# Patient Record
Sex: Male | Born: 1935 | Race: White | Hispanic: No | Smoking: Former smoker
Health system: Southern US, Community
[De-identification: ages and names within clinical notes are randomized; demographics above are authoritative.]

## PROBLEM LIST (undated history)

## (undated) DIAGNOSIS — I639 Cerebral infarction, unspecified: Secondary | ICD-10-CM

## (undated) DIAGNOSIS — J449 Chronic obstructive pulmonary disease, unspecified: Secondary | ICD-10-CM

## (undated) DIAGNOSIS — J189 Pneumonia, unspecified organism: Secondary | ICD-10-CM

## (undated) DIAGNOSIS — T17908A Unspecified foreign body in respiratory tract, part unspecified causing other injury, initial encounter: Secondary | ICD-10-CM

## (undated) HISTORY — PX: JOINT REPLACEMENT: SHX530

---

## 2017-04-29 ENCOUNTER — Encounter (HOSPITAL_COMMUNITY): Payer: Self-pay | Admitting: Emergency Medicine

## 2017-04-29 ENCOUNTER — Emergency Department (HOSPITAL_COMMUNITY)
Admission: EM | Admit: 2017-04-29 | Discharge: 2017-04-29 | Disposition: A | Discharge status: Home / Self Care | Payer: Medicare Other

## 2017-04-29 ENCOUNTER — Emergency Department (HOSPITAL_COMMUNITY): Payer: Medicare Other

## 2017-04-29 ENCOUNTER — Emergency Department (HOSPITAL_COMMUNITY)
Admission: EM | Admit: 2017-04-29 | Discharge: 2017-04-29 | Disposition: A | Discharge status: Home / Self Care | Payer: Medicare Other | Attending: Emergency Medicine | Admitting: Emergency Medicine

## 2017-04-29 DIAGNOSIS — R42 Dizziness and giddiness: Secondary | ICD-10-CM | POA: Insufficient documentation

## 2017-04-29 DIAGNOSIS — N3000 Acute cystitis without hematuria: Secondary | ICD-10-CM | POA: Diagnosis not present

## 2017-04-29 DIAGNOSIS — R109 Unspecified abdominal pain: Secondary | ICD-10-CM | POA: Diagnosis present

## 2017-04-29 LAB — URINALYSIS, ROUTINE W REFLEX MICROSCOPIC
Bilirubin Urine: NEGATIVE
Glucose, UA: NEGATIVE mg/dL
KETONES UR: NEGATIVE mg/dL
Nitrite: POSITIVE — AB
PROTEIN: NEGATIVE mg/dL
Specific Gravity, Urine: 1.009 (ref 1.005–1.030)
pH: 6 (ref 5.0–8.0)

## 2017-04-29 LAB — COMPREHENSIVE METABOLIC PANEL
ALBUMIN: 3.7 g/dL (ref 3.5–5.0)
ALK PHOS: 51 U/L (ref 38–126)
ALT: 12 U/L — AB (ref 17–63)
ANION GAP: 7 (ref 5–15)
AST: 17 U/L (ref 15–41)
Anion gap: 7 (ref 5–15)
BUN: 20 mg/dL (ref 6–20)
CALCIUM: 8.3 mg/dL — AB (ref 8.9–10.3)
CO2: 25 mmol/L (ref 22–32)
CREATININE: 0.9 mg/dL (ref 0.61–1.24)
Chloride: 101 mmol/L (ref 101–111)
GFR calc Af Amer: >60 mL/min (ref >60)
GFR calc non Af Amer: >60 mL/min (ref >60)
GLUCOSE: 109 mg/dL — AB (ref 65–99)
Potassium: 3.9 mmol/L (ref 3.5–5.1)
SODIUM: 133 mmol/L — AB (ref 135–145)
Total Bilirubin: 0.4 mg/dL (ref 0.3–1.2)
Total Protein: 6.2 g/dL — ABNORMAL LOW (ref 6.5–8.1)

## 2017-04-29 LAB — CBC
HCT: 28.4 % — ABNORMAL LOW (ref 39.0–52.0)
Hemoglobin: 10 g/dL — ABNORMAL LOW (ref 13.0–17.0)
MCH: 31.4 pg (ref 26.0–34.0)
MCHC: 35.2 g/dL (ref 30.0–36.0)
MCV: 89.3 fL (ref 78.0–100.0)
PLATELETS: 135 10*3/uL — AB (ref 150–400)
RBC: 3.18 MIL/uL — ABNORMAL LOW (ref 4.22–5.81)
RDW: 13.3 % (ref 11.5–15.5)
WBC: 9.6 10*3/uL (ref 4.0–10.5)

## 2017-04-29 LAB — LIPASE, BLOOD: Lipase: 32 U/L (ref 11–51)

## 2017-04-29 MED ORDER — SODIUM CHLORIDE 0.9 % IV BOLUS (SEPSIS)
1000.0000 mL | Freq: Once | INTRAVENOUS | Status: AC
  Administered 2017-04-29: 1000 mL via INTRAVENOUS

## 2017-04-29 MED ORDER — SODIUM CHLORIDE 0.9 % IV BOLUS (SEPSIS)
1000.0000 mL | Freq: Once | INTRAVENOUS | Status: DC
Start: 2017-04-29 — End: 2017-04-29

## 2017-04-29 MED ORDER — CEPHALEXIN 500 MG PO CAPS: 500.0000 mg | ORAL_CAPSULE | Freq: Two times a day (BID) | ORAL | 0 refills | Status: DC

## 2017-04-29 MED ORDER — DEXTROSE 5 % IV SOLN
1.0000 g | Freq: Once | INTRAVENOUS | Status: AC
  Administered 2017-04-29: 1 g via INTRAVENOUS
  Filled 2017-04-29: qty 10

## 2017-04-29 NOTE — ED Triage Notes (Addendum)
Pt comes to ed, via ems from home c/o generalized abdominal pain with diarrhea. Pt has slurred speech/ left side deficients and dementia at base line per report to ems to caregiver ( grand daughter) home address [address], [place] [phone]. Pt walks w walker and assistance. Hx of TIA. V/s on arrival 109/60, hr 50 reg rr16, spo2 97.   NOTE HIS WIFE JUST DIED ( depressed )   

## 2017-04-29 NOTE — ED Triage Notes (Signed)
Pt comes to ed, via ems from home c/o generalized abdominal pain with diarrhea. Pt has slurred speech/ left side deficients and dementia at base line per report to ems to caregiver ( grand daughter) home address [address], [place] [phone]. Pt walks w walker and assistance. Hx of TIA. V/s on arrival 109/60, hr 50 reg rr16, spo2 97.   NOTE HIS WIFE JUST DIED ( depressed )

## 2017-04-29 NOTE — ED Notes (Signed)
Bed: WA12 Expected date:  Expected time:  Means of arrival:  Comments: [facility] 

## 2017-04-29 NOTE — ED Provider Notes (Signed)
[facility]-EMERGENCY DEPT Provider Note   CSN: [id number] Arrival date & time: 04/29/17  0350     History   Chief Complaint Chief Complaint  Patient presents with  . Abdominal Pain  . Diarrhea  . Depression    HPI David [name] is a 82 y.o. male.  Patient presents to the ED with a chief complaint of fatigue and having some loose stools.  He is brought in by his granddaughter, who states that he seemed weaker than normal, and was also concerned that he had some dizziness.  The granddaughter states that she just wanted to get him checked out because he wasn't feeling well.  She denies that he has had fever, chill, nausea, or vomiting.  He denies being in any pain, including chest pain and abdominal pain.  He has not taken anything for his symptoms.  There are no other associated symptoms or modifying factors.   The history is provided by the patient. No language interpreter was used.    History reviewed. No pertinent past medical history.  There are no active problems to display for this patient.   History reviewed. No pertinent surgical history.     Home Medications    Prior to Admission medications   Medication Sig Start Date End Date Taking? Authorizing Provider  cephALEXin (KEFLEX) 500 MG capsule Take 1 capsule (500 mg total) by mouth 2 (two) times daily. 04/29/17   Roxy Horseman, PA-C    Family History No family history on file.  Social History Social History  Substance Use Topics  . Smoking status: Not on file  . Smokeless tobacco: Not on file  . Alcohol use Not on file     Allergies   Patient has no allergy information on record.   Review of Systems Review of Systems  All other systems reviewed and are negative.    Physical Exam Updated Vital Signs There were no vitals taken for this visit.  Physical Exam  Constitutional: He appears well-developed and well-nourished.  HENT:  Head: Normocephalic and atraumatic.  Eyes: Conjunctivae and EOM  are normal. Pupils are equal, round, and reactive to light. Right eye exhibits no discharge. Left eye exhibits no discharge. No scleral icterus.  Neck: Normal range of motion. Neck supple. No JVD present.  Cardiovascular: Normal rate, regular rhythm and normal heart sounds.  Exam reveals no gallop and no friction rub.   No murmur heard. Pulmonary/Chest: Effort normal and breath sounds normal. No respiratory distress. He has no wheezes. He has no rales. He exhibits no tenderness.  Abdominal: Soft. He exhibits no distension and no mass. There is no tenderness. There is no rebound and no guarding.  Musculoskeletal: Normal range of motion. He exhibits no edema or tenderness.  Moves all extremities, slightly diminished on left side, which is baseline  Neurological: He is alert.  demented  Skin: Skin is warm and dry.  Psychiatric: He has a normal mood and affect. His behavior is normal. Judgment and thought content normal.  Nursing note and vitals reviewed.    ED Treatments / Results  Labs (all labs ordered are listed, but only abnormal results are displayed) Labs Reviewed  COMPREHENSIVE METABOLIC PANEL - Abnormal; Notable for the following:       Result Value   Sodium 133 (*)    Glucose, Bld 109 (*)    Calcium 8.3 (*)    Total Protein 6.2 (*)    ALT 12 (*)    All other components within normal limits  CBC - Abnormal; Notable for the following:    RBC 3.18 (*)    Hemoglobin 10.0 (*)    HCT 28.4 (*)    Platelets 135 (*)    All other components within normal limits  URINALYSIS, ROUTINE W REFLEX MICROSCOPIC - Abnormal; Notable for the following:    APPearance HAZY (*)    Hgb urine dipstick MODERATE (*)    Nitrite POSITIVE (*)    Leukocytes, UA MODERATE (*)    Bacteria, UA RARE (*)    Squamous Epithelial / LPF 0-5 (*)    All other components within normal limits  URINE CULTURE  LIPASE, BLOOD    EKG  EKG Interpretation None       Radiology Dg Chest 2 View  Result Date:  04/29/2017 CLINICAL DATA:  82 year old male with weakness and confusion. EXAM: CHEST  2 VIEW COMPARISON:  None FINDINGS: Shallow inspiration with bibasilar atelectasis/scarring. No focal consolidation, pleural effusion, or pneumothorax. Left hilar calcified granuloma. The cardiac silhouette is within normal limits. No acute osseous pathology. There is interposition of the transverse colon under the right hemidiaphragm. IMPRESSION: No active cardiopulmonary disease. Electronically Signed   By: Elgie CollardArash  Radparvar M.D.   On: 04/29/2017 04:33   Ct Head Wo Contrast  Result Date: 04/29/2017 CLINICAL DATA:  82 year old male with weakness and dizziness. History of TIA. EXAM: CT HEAD WITHOUT CONTRAST TECHNIQUE: Contiguous axial images were obtained from the base of the skull through the vertex without intravenous contrast. COMPARISON:  None. FINDINGS: Brain: There is advanced age-related atrophy and chronic microvascular ischemic changes. No acute intracranial hemorrhage. No mass effect or midline shift. No extra-axial fluid collections. Vascular: No hyperdense vessel or unexpected calcification. Skull: Normal. Negative for fracture or focal lesion. Sinuses/Orbits: No acute finding. Other: None IMPRESSION: 1. No acute intracranial hemorrhage. 2. Age-related atrophy and chronic microvascular ischemic changes. If symptoms persist, and there are no contraindications, MRI may provide better evaluation if clinically indicated. Electronically Signed   By: Elgie CollardArash  Radparvar M.D.   On: 04/29/2017 04:45    Procedures Procedures (including critical care time)  Medications Ordered in ED Medications  cefTRIAXone (ROCEPHIN) 1 g in dextrose 5 % 50 mL IVPB (1 g Intravenous New Bag/Given 04/29/17 0515)  sodium chloride 0.9 % bolus 1,000 mL (0 mLs Intravenous Stopped 04/29/17 0420)     Initial Impression / Assessment and Plan / ED Course  I have reviewed the triage vital signs and the nursing notes.  Pertinent labs & imaging  results that were available during my care of the patient were reviewed by me and considered in my medical decision making (see chart for details).     Patient feeling fatigued and generally not well.  No chest pain or abdominal pain.  Granddaughter wanted him evaluated because he said he was not feeling well.  States that he may have had some weakness, and has had a TIA in the past.  No vomiting in the ED.  VSS.  CT head is negative.  CXR is normal.  Labs are reassuring.  UA is consistent with UTI.  I believe that his UTI is the source of his symptoms.  Patient given Rocephin in the ED.  Family member feels comfortable with plan for discharge with home antibiotics.  Recommend PCP follow-up.  Patient understands and agrees with the plan.  Final Clinical Impressions(s) / ED Diagnoses   Final diagnoses:  Acute cystitis without hematuria    New Prescriptions New Prescriptions   CEPHALEXIN (KEFLEX) 500 MG CAPSULE  Take 1 capsule (500 mg total) by mouth 2 (two) times daily.     Daronte, Shostak, PA-C 04/29/17 1610    Nicanor Alcon, April, MD 04/29/17 [phone]

## 2017-04-29 NOTE — ED Notes (Signed)
Erroneous encounter due to identification error.

## 2017-04-29 NOTE — ED Notes (Addendum)
Pt yellow band placed on wrist

## 2017-04-29 NOTE — ED Notes (Addendum)
Patient is A&O to self and place with slow response. Granddaughter at bedside who verbalizes this is normal and some mild weakness to left side is normal. Patient able to grip. Denies any abdominal pain or nausea. Per granddaughter patient was at home and was sitting on toilet and looked pale in color and was having loose stools. Report only having one stool however when she wiped him several times he continued to go. Patient denies pain at this time. States "I was a little dizzy at home but nothing major". Reports some mild dizziness hear. Denies depression states "I am fine who told you that!'

## 2017-05-09 ENCOUNTER — Emergency Department (HOSPITAL_BASED_OUTPATIENT_CLINIC_OR_DEPARTMENT_OTHER): Payer: Medicare Other

## 2017-05-09 ENCOUNTER — Encounter (HOSPITAL_BASED_OUTPATIENT_CLINIC_OR_DEPARTMENT_OTHER): Payer: Self-pay | Admitting: *Deleted

## 2017-05-09 ENCOUNTER — Inpatient Hospital Stay (HOSPITAL_BASED_OUTPATIENT_CLINIC_OR_DEPARTMENT_OTHER)
Admission: EM | Admit: 2017-05-09 | Discharge: 2017-05-16 | DRG: 871 | Disposition: A | Discharge status: Home Health | Payer: Medicare Other | Attending: Family Medicine | Admitting: Family Medicine

## 2017-05-09 DIAGNOSIS — Z8701 Personal history of pneumonia (recurrent): Secondary | ICD-10-CM

## 2017-05-09 DIAGNOSIS — I4510 Unspecified right bundle-branch block: Secondary | ICD-10-CM | POA: Diagnosis present

## 2017-05-09 DIAGNOSIS — I4891 Unspecified atrial fibrillation: Secondary | ICD-10-CM | POA: Diagnosis present

## 2017-05-09 DIAGNOSIS — I480 Paroxysmal atrial fibrillation: Secondary | ICD-10-CM

## 2017-05-09 DIAGNOSIS — R630 Anorexia: Secondary | ICD-10-CM | POA: Diagnosis present

## 2017-05-09 DIAGNOSIS — I471 Supraventricular tachycardia: Secondary | ICD-10-CM | POA: Diagnosis not present

## 2017-05-09 DIAGNOSIS — E861 Hypovolemia: Secondary | ICD-10-CM | POA: Diagnosis present

## 2017-05-09 DIAGNOSIS — R000 Tachycardia, unspecified: Secondary | ICD-10-CM | POA: Diagnosis present

## 2017-05-09 DIAGNOSIS — R4781 Slurred speech: Secondary | ICD-10-CM | POA: Diagnosis present

## 2017-05-09 DIAGNOSIS — Z87891 Personal history of nicotine dependence: Secondary | ICD-10-CM

## 2017-05-09 DIAGNOSIS — Z6824 Body mass index (BMI) 24.0-24.9, adult: Secondary | ICD-10-CM | POA: Diagnosis not present

## 2017-05-09 DIAGNOSIS — A419 Sepsis, unspecified organism: Secondary | ICD-10-CM | POA: Diagnosis not present

## 2017-05-09 DIAGNOSIS — I9589 Other hypotension: Secondary | ICD-10-CM | POA: Diagnosis not present

## 2017-05-09 DIAGNOSIS — R195 Other fecal abnormalities: Secondary | ICD-10-CM | POA: Diagnosis not present

## 2017-05-09 DIAGNOSIS — E8770 Fluid overload, unspecified: Secondary | ICD-10-CM | POA: Diagnosis present

## 2017-05-09 DIAGNOSIS — E876 Hypokalemia: Secondary | ICD-10-CM | POA: Diagnosis present

## 2017-05-09 DIAGNOSIS — R531 Weakness: Secondary | ICD-10-CM

## 2017-05-09 DIAGNOSIS — D649 Anemia, unspecified: Secondary | ICD-10-CM | POA: Diagnosis not present

## 2017-05-09 DIAGNOSIS — F0390 Unspecified dementia without behavioral disturbance: Secondary | ICD-10-CM | POA: Diagnosis present

## 2017-05-09 DIAGNOSIS — Z9181 History of falling: Secondary | ICD-10-CM

## 2017-05-09 DIAGNOSIS — J449 Chronic obstructive pulmonary disease, unspecified: Secondary | ICD-10-CM | POA: Diagnosis present

## 2017-05-09 DIAGNOSIS — N390 Urinary tract infection, site not specified: Secondary | ICD-10-CM | POA: Diagnosis not present

## 2017-05-09 DIAGNOSIS — R4182 Altered mental status, unspecified: Secondary | ICD-10-CM | POA: Diagnosis not present

## 2017-05-09 DIAGNOSIS — F329 Major depressive disorder, single episode, unspecified: Secondary | ICD-10-CM | POA: Diagnosis present

## 2017-05-09 DIAGNOSIS — Z96643 Presence of artificial hip joint, bilateral: Secondary | ICD-10-CM | POA: Diagnosis present

## 2017-05-09 DIAGNOSIS — K862 Cyst of pancreas: Secondary | ICD-10-CM | POA: Diagnosis not present

## 2017-05-09 DIAGNOSIS — E43 Unspecified severe protein-calorie malnutrition: Secondary | ICD-10-CM | POA: Insufficient documentation

## 2017-05-09 DIAGNOSIS — R5383 Other fatigue: Secondary | ICD-10-CM

## 2017-05-09 DIAGNOSIS — K529 Noninfective gastroenteritis and colitis, unspecified: Secondary | ICD-10-CM

## 2017-05-09 DIAGNOSIS — R410 Disorientation, unspecified: Secondary | ICD-10-CM | POA: Diagnosis not present

## 2017-05-09 DIAGNOSIS — A0472 Enterocolitis due to Clostridium difficile, not specified as recurrent: Secondary | ICD-10-CM | POA: Diagnosis not present

## 2017-05-09 DIAGNOSIS — G253 Myoclonus: Secondary | ICD-10-CM | POA: Diagnosis present

## 2017-05-09 DIAGNOSIS — R0902 Hypoxemia: Secondary | ICD-10-CM | POA: Diagnosis not present

## 2017-05-09 DIAGNOSIS — F05 Delirium due to known physiological condition: Secondary | ICD-10-CM | POA: Diagnosis not present

## 2017-05-09 DIAGNOSIS — R0602 Shortness of breath: Secondary | ICD-10-CM

## 2017-05-09 DIAGNOSIS — E860 Dehydration: Secondary | ICD-10-CM | POA: Diagnosis not present

## 2017-05-09 DIAGNOSIS — E872 Acidosis: Secondary | ICD-10-CM | POA: Diagnosis present

## 2017-05-09 DIAGNOSIS — Z8673 Personal history of transient ischemic attack (TIA), and cerebral infarction without residual deficits: Secondary | ICD-10-CM

## 2017-05-09 DIAGNOSIS — F32A Depression, unspecified: Secondary | ICD-10-CM | POA: Diagnosis present

## 2017-05-09 DIAGNOSIS — J9601 Acute respiratory failure with hypoxia: Secondary | ICD-10-CM | POA: Diagnosis present

## 2017-05-09 DIAGNOSIS — K869 Disease of pancreas, unspecified: Secondary | ICD-10-CM

## 2017-05-09 DIAGNOSIS — R197 Diarrhea, unspecified: Secondary | ICD-10-CM | POA: Diagnosis not present

## 2017-05-09 DIAGNOSIS — Z79899 Other long term (current) drug therapy: Secondary | ICD-10-CM

## 2017-05-09 DIAGNOSIS — E871 Hypo-osmolality and hyponatremia: Secondary | ICD-10-CM | POA: Diagnosis not present

## 2017-05-09 HISTORY — DX: Cerebral infarction, unspecified: I63.9

## 2017-05-09 HISTORY — DX: Unspecified foreign body in respiratory tract, part unspecified causing other injury, initial encounter: T17.908A

## 2017-05-09 HISTORY — DX: Chronic obstructive pulmonary disease, unspecified: J44.9

## 2017-05-09 HISTORY — DX: Pneumonia, unspecified organism: J18.9

## 2017-05-09 LAB — COMPREHENSIVE METABOLIC PANEL
ALK PHOS: 59 U/L (ref 38–126)
ALT: 12 U/L — AB (ref 17–63)
AST: 17 U/L (ref 15–41)
Albumin: 3.7 g/dL (ref 3.5–5.0)
Anion gap: 8 (ref 5–15)
BUN: 16 mg/dL (ref 6–20)
CALCIUM: 8.7 mg/dL — AB (ref 8.9–10.3)
CO2: 24 mmol/L (ref 22–32)
Chloride: 102 mmol/L (ref 101–111)
Creatinine, Ser: 1.04 mg/dL (ref 0.61–1.24)
GFR calc Af Amer: >60 mL/min (ref >60)
GFR calc non Af Amer: >60 mL/min (ref >60)
GLUCOSE: 123 mg/dL — AB (ref 65–99)
Potassium: 3.7 mmol/L (ref 3.5–5.1)
SODIUM: 134 mmol/L — AB (ref 135–145)
Total Bilirubin: 0.5 mg/dL (ref 0.3–1.2)
Total Protein: 6.3 g/dL — ABNORMAL LOW (ref 6.5–8.1)

## 2017-05-09 LAB — URINALYSIS, ROUTINE W REFLEX MICROSCOPIC
Bilirubin Urine: NEGATIVE
GLUCOSE, UA: NEGATIVE mg/dL
Hgb urine dipstick: NEGATIVE
Ketones, ur: NEGATIVE mg/dL
NITRITE: NEGATIVE
PH: 5.5 (ref 5.0–8.0)
Protein, ur: NEGATIVE mg/dL
Specific Gravity, Urine: 1.012 (ref 1.005–1.030)

## 2017-05-09 LAB — CBC WITH DIFFERENTIAL/PLATELET
BASOS PCT: 0 %
Basophils Absolute: 0 10*3/uL (ref 0.0–0.1)
EOS ABS: 0.1 10*3/uL (ref 0.0–0.7)
Eosinophils Relative: 1 %
HCT: 32.1 % — ABNORMAL LOW (ref 39.0–52.0)
HEMOGLOBIN: 11 g/dL — AB (ref 13.0–17.0)
Lymphocytes Relative: 8 %
Lymphs Abs: 0.8 10*3/uL (ref 0.7–4.0)
MCH: 31.1 pg (ref 26.0–34.0)
MCHC: 34.3 g/dL (ref 30.0–36.0)
MCV: 90.7 fL (ref 78.0–100.0)
Monocytes Absolute: 0.7 10*3/uL (ref 0.1–1.0)
Monocytes Relative: 7 %
NEUTROS PCT: 84 %
Neutro Abs: 8.5 10*3/uL — ABNORMAL HIGH (ref 1.7–7.7)
Platelets: 170 10*3/uL (ref 150–400)
RBC: 3.54 MIL/uL — AB (ref 4.22–5.81)
RDW: 13.4 % (ref 11.5–15.5)
WBC: 10.1 10*3/uL (ref 4.0–10.5)

## 2017-05-09 LAB — URINALYSIS, MICROSCOPIC (REFLEX)

## 2017-05-09 LAB — TROPONIN I: Troponin I: <0.03 ng/mL (ref <0.03)

## 2017-05-09 LAB — I-STAT CG4 LACTIC ACID, ED: Lactic Acid, Venous: 0.9 mmol/L (ref 0.5–1.9)

## 2017-05-09 LAB — LIPASE, BLOOD: Lipase: 27 U/L (ref 11–51)

## 2017-05-09 LAB — OCCULT BLOOD X 1 CARD TO LAB, STOOL: Fecal Occult Bld: POSITIVE — AB

## 2017-05-09 MED ORDER — TAMSULOSIN HCL 0.4 MG PO CAPS
0.4000 mg | ORAL_CAPSULE | Freq: Every day | ORAL | Status: DC
  Administered 2017-05-10 – 2017-05-16 (×7): 0.4 mg via ORAL
  Filled 2017-05-09 (×7): qty 1

## 2017-05-09 MED ORDER — GABAPENTIN 300 MG PO CAPS
300.0000 mg | ORAL_CAPSULE | Freq: Three times a day (TID) | ORAL | Status: DC
  Administered 2017-05-09 – 2017-05-10 (×2): 300 mg via ORAL
  Filled 2017-05-09 (×2): qty 1

## 2017-05-09 MED ORDER — ZINC OXIDE 11.3 % EX CREA
TOPICAL_CREAM | CUTANEOUS | Status: AC
  Administered 2017-05-09: 1
  Filled 2017-05-09: qty 56

## 2017-05-09 MED ORDER — SACCHAROMYCES BOULARDII 250 MG PO CAPS
250.0000 mg | ORAL_CAPSULE | Freq: Every day | ORAL | Status: DC
  Administered 2017-05-10: 250 mg via ORAL
  Filled 2017-05-09: qty 1

## 2017-05-09 MED ORDER — VITAL-D RX 1 MG PO TABS
1.0000 | ORAL_TABLET | Freq: Every day | ORAL | Status: DC
Start: 2017-05-10 — End: 2017-05-09

## 2017-05-09 MED ORDER — SODIUM CHLORIDE 0.9 % IV BOLUS (SEPSIS)
1000.0000 mL | Freq: Once | INTRAVENOUS | Status: AC
  Administered 2017-05-09: 1000 mL via INTRAVENOUS

## 2017-05-09 MED ORDER — HYDROCODONE-ACETAMINOPHEN 5-325 MG PO TABS: 1.0000 | ORAL_TABLET | ORAL | Status: DC | PRN

## 2017-05-09 MED ORDER — METRONIDAZOLE IN NACL 5-0.79 MG/ML-% IV SOLN
500.0000 mg | Freq: Once | INTRAVENOUS | Status: AC
  Administered 2017-05-09: 500 mg via INTRAVENOUS
  Filled 2017-05-09: qty 100

## 2017-05-09 MED ORDER — ONDANSETRON HCL 4 MG PO TABS: 4.0000 mg | ORAL_TABLET | Freq: Four times a day (QID) | ORAL | Status: DC | PRN

## 2017-05-09 MED ORDER — ONDANSETRON HCL 4 MG/2ML IJ SOLN
4.0000 mg | Freq: Four times a day (QID) | INTRAMUSCULAR | Status: DC | PRN
  Administered 2017-05-10: 4 mg via INTRAVENOUS
  Filled 2017-05-09: qty 2

## 2017-05-09 MED ORDER — CIPROFLOXACIN IN D5W 400 MG/200ML IV SOLN: 400.0000 mg | Freq: Two times a day (BID) | INTRAVENOUS | Status: DC

## 2017-05-09 MED ORDER — ACETAMINOPHEN 325 MG PO TABS
650.0000 mg | ORAL_TABLET | Freq: Four times a day (QID) | ORAL | Status: DC | PRN
  Filled 2017-05-09: qty 2

## 2017-05-09 MED ORDER — METRONIDAZOLE IN NACL 5-0.79 MG/ML-% IV SOLN
500.0000 mg | Freq: Three times a day (TID) | INTRAVENOUS | Status: DC
  Administered 2017-05-09 – 2017-05-14 (×14): 500 mg via INTRAVENOUS
  Filled 2017-05-09 (×15): qty 100

## 2017-05-09 MED ORDER — FINASTERIDE 5 MG PO TABS
5.0000 mg | ORAL_TABLET | Freq: Every day | ORAL | Status: DC
  Administered 2017-05-10 – 2017-05-16 (×7): 5 mg via ORAL
  Filled 2017-05-09 (×7): qty 1

## 2017-05-09 MED ORDER — IOPAMIDOL (ISOVUE-300) INJECTION 61%
100.0000 mL | Freq: Once | INTRAVENOUS | Status: AC | PRN
  Administered 2017-05-09: 100 mL via INTRAVENOUS

## 2017-05-09 MED ORDER — ADULT MULTIVITAMIN W/MINERALS CH
1.0000 | ORAL_TABLET | Freq: Every day | ORAL | Status: DC
  Administered 2017-05-10 – 2017-05-16 (×7): 1 via ORAL
  Filled 2017-05-09 (×7): qty 1

## 2017-05-09 MED ORDER — CIPROFLOXACIN IN D5W 400 MG/200ML IV SOLN
400.0000 mg | Freq: Once | INTRAVENOUS | Status: AC
Start: 2017-05-09 — End: 2017-05-09
  Administered 2017-05-09: 400 mg via INTRAVENOUS
  Filled 2017-05-09: qty 200

## 2017-05-09 MED ORDER — LORATADINE 10 MG PO TABS
10.0000 mg | ORAL_TABLET | Freq: Every day | ORAL | Status: DC
  Administered 2017-05-10 – 2017-05-16 (×7): 10 mg via ORAL
  Filled 2017-05-09 (×7): qty 1

## 2017-05-09 MED ORDER — SODIUM CHLORIDE 0.9 % IV SOLN
INTRAVENOUS | Status: DC
  Administered 2017-05-09: 23:00:00 via INTRAVENOUS

## 2017-05-09 MED ORDER — SODIUM CHLORIDE 0.9% FLUSH
3.0000 mL | Freq: Two times a day (BID) | INTRAVENOUS | Status: DC
  Administered 2017-05-11 – 2017-05-16 (×8): 3 mL via INTRAVENOUS

## 2017-05-09 MED ORDER — ACETAMINOPHEN 650 MG RE SUPP: 650.0000 mg | Freq: Four times a day (QID) | RECTAL | Status: DC | PRN

## 2017-05-09 MED ORDER — CITALOPRAM HYDROBROMIDE 10 MG/5ML PO SOLN
20.0000 mg | Freq: Every day | ORAL | Status: DC
  Administered 2017-05-10 – 2017-05-14 (×5): 20 mg via ORAL
  Filled 2017-05-09 (×7): qty 10

## 2017-05-09 NOTE — Progress Notes (Signed)
[facility]. Alert and oriented to self. Incontinent of diarrhea x 2. Pt. Cleaned, condom cath applied, and settled into bed. Skin tear noted to right hand Tegaderm applied. Bruise noted to left medial thigh.  No respiratory distress noted.

## 2017-05-09 NOTE — Progress Notes (Signed)
Patient is a 82 yo Male with a PMH of HTN, Depression, BPH, Hx of Dementia and Slurred Speech at baseline who [facility] with generalized weakness and diagnosed with Colitis as evidenced by CT Scan and multiple episodes of diarrhea. Has a new Pancreatic Lesion as well. Patient is Afebrile and no Leukocytosis. Per EDP no Blood seen in stool. Patient has received 1.5 NS bolus and was placed on Cipro/Flagyl. Per EDP when patient has a bowel movement tends to vasovagal and drop BP but improved with IVF Rehydration. No hx of seeing GI. Recently diagnosed with UTI and placed on Keflex. C Diff Pending and GI Pathogen Panel being sent by EDP currently. [facility] and [facility] Bed.

## 2017-05-09 NOTE — ED Provider Notes (Signed)
[facility]   CSN: [id number] Arrival date & time: 05/09/17  1218     History   Chief Complaint Chief Complaint  Patient presents with  . Weakness    HPI David [name] is a 82 y.o. male.  HPI   82 yo M with h/o PNA, COPD here with weakness. Pt was seen last week and diagnosed with UTI after having near syncopal episode while having a loose BM. Since then, he has had persistent loose, watery, large-volume BM. Earlier today, pt was having a BM when he became sweaty, diaphoretic, and was mumbling and minimally coherent. This occurs in setting of several days of generalized weakness and confusion. No known fevers. Has been taking his keflex as prescribed. His diarrhea is now increasingly watery.   Past Medical History:  Diagnosis Date  . Aspiration into airway   . COPD (chronic obstructive pulmonary disease) (HCC)   . Pneumonia   . Stroke [facility])    TIA's per CT scan    Patient Active Problem List   Diagnosis Date Noted  . Colitis 05/09/2017  . Depression 05/09/2017  . Hyponatremia 05/09/2017  . Normocytic anemia 05/09/2017  . Dementia 05/09/2017  . Pancreatic lesion 05/09/2017  . Colitis presumed infectious 05/09/2017  . Occult blood positive stool 05/09/2017    Past Surgical History:  Procedure Laterality Date  . JOINT REPLACEMENT Bilateral    Hip replacemen       Home Medications    Prior to Admission medications   Medication Sig Start Date End Date Taking? Authorizing Provider  B Complex-C-Biotin-D-Zinc-FA (VITAL-D RX PO) Take by mouth.   Yes [provider]  citalopram (CELEXA) 10 MG/5ML suspension Take 20 mg by mouth daily.   Yes [provider]  Desloratadine (CLARINEX PO) Take by mouth.   Yes [provider]  finasteride (PROSCAR) 5 MG tablet Take 5 mg by mouth daily.   Yes [provider]  gabapentin (NEURONTIN) 300 MG capsule Take 300 mg by mouth 3 (three) times daily.   Yes [provider]  Lactobacillus (PROBIATA PO) Take by mouth.   Yes [provider]  Melatonin-Pyridoxine (MELATONIN-B6 PO) Take by mouth.   Yes [provider]  Multiple Vitamin (MULTIVITAMIN) capsule Take 1 capsule by mouth daily.   Yes [provider]  tamsulosin (FLOMAX) 0.4 MG CAPS capsule Take 0.4 mg by mouth.   Yes [provider]  cephALEXin (KEFLEX) 500 MG capsule Take 1 capsule (500 mg total) by mouth 2 (two) times daily. 04/29/17   Roxy Horseman, PA-C    Family History History reviewed. No pertinent family history.  Social History Social History  Substance Use Topics  . Smoking status: Former Games developer  . Smokeless tobacco: Never Used  . Alcohol use No     Allergies   Patient has no known allergies.   Review of Systems Review of Systems  Constitutional: Positive for chills and fatigue.  Gastrointestinal: Positive for abdominal pain, diarrhea and nausea.  Neurological: Positive for syncope and weakness.  All other systems reviewed and are negative.    Physical Exam Updated Vital Signs BP (!) 121/91 (BP Location: Right Arm)   Pulse (!) 118   Temp 97.9 F (36.6 C) (Oral)   Resp 16   Wt 63.8 kg (140 lb 10.5 oz)   SpO2 98%   BMI 22.70 kg/m   Physical Exam  Constitutional: He is oriented to person, place, and time. He appears well-developed.  Thin, frail, elderly  HENT:  Head: Normocephalic and atraumatic.  Dry MM  Eyes: Conjunctivae are normal.  Neck: Neck supple.  Cardiovascular: Normal rate, regular rhythm and normal heart sounds.  Exam reveals no friction rub.   No murmur heard. Pulmonary/Chest: Effort normal and breath sounds normal. No respiratory distress. He has no wheezes. He has no rales.  Abdominal: He exhibits no distension. There is tenderness in the suprapubic area, left upper quadrant and left lower quadrant. There is guarding.  Musculoskeletal: He exhibits no edema.  Neurological: He is alert and oriented  to person, place, and time. He exhibits normal muscle tone.  Skin: Skin is warm. Capillary refill takes less than 2 seconds.  Psychiatric: He has a normal mood and affect.  Nursing note and vitals reviewed.    ED Treatments / Results  Labs (all labs ordered are listed, but only abnormal results are displayed) Labs Reviewed  CBC WITH DIFFERENTIAL/PLATELET - Abnormal; Notable for the following:       Result Value   RBC 3.54 (*)    Hemoglobin 11.0 (*)    HCT 32.1 (*)    Neutro Abs 8.5 (*)    All other components within normal limits  COMPREHENSIVE METABOLIC PANEL - Abnormal; Notable for the following:    Sodium 134 (*)    Glucose, Bld 123 (*)    Calcium 8.7 (*)    Total Protein 6.3 (*)    ALT 12 (*)    All other components within normal limits  URINALYSIS, ROUTINE W REFLEX MICROSCOPIC - Abnormal; Notable for the following:    Leukocytes, UA TRACE (*)    All other components within normal limits  URINALYSIS, MICROSCOPIC (REFLEX) - Abnormal; Notable for the following:    Bacteria, UA RARE (*)    Squamous Epithelial / LPF 0-5 (*)    All other components within normal limits  OCCULT BLOOD X 1 CARD TO LAB, STOOL - Abnormal; Notable for the following:    Fecal Occult Bld POSITIVE (*)    All other components within normal limits  CULTURE, BLOOD (ROUTINE X 2)  CULTURE, BLOOD (ROUTINE X 2)  URINE CULTURE  C DIFFICILE QUICK SCREEN W PCR REFLEX  GASTROINTESTINAL PANEL BY PCR, STOOL (REPLACES STOOL CULTURE)  TROPONIN I  LIPASE, BLOOD  BASIC METABOLIC PANEL  CBC WITH DIFFERENTIAL/PLATELET  I-STAT CG4 LACTIC ACID, ED    EKG  EKG Interpretation  Date/Time:  Saturday May 09 2017 12:30:51 EDT Ventricular Rate:  70 PR Interval:  132 QRS Duration: 102 QT Interval:  412 QTC Calculation: 444 R Axis:   -71 Text Interpretation:  Sinus rhythm with occasional Premature ventricular complexes Left axis deviation Low voltage QRS Incomplete right bundle branch block Abnormal ECG Confirmed  by Shaune PollackIsaacs, Lynna Zamorano [phone]([id number]) on 05/09/2017 4:18:07 PM       Radiology Dg Chest 2 View  Result Date: 05/09/2017 CLINICAL DATA:  Sudden onset weakness. EXAM: CHEST  2 VIEW COMPARISON:  04/29/2017 FINDINGS: AP and lateral views of the chest show hyperexpansion with chronic interstitial coarsening. No focal airspace consolidation. No pulmonary edema or pleural effusion. The cardiopericardial silhouette is within normal limits for size. Lower thoracic compression fracture is similar to prior. Telemetry leads overlie the chest. IMPRESSION: Stable.  Emphysema without acute cardiopulmonary findings. Electronically Signed   By: Kennith CenterEric  Mansell M.D.   On: 05/09/2017 15:58   Ct Abdomen Pelvis W Contrast  Result Date: 05/09/2017 CLINICAL DATA:  Lower abdominal pain and diarrhea today. Recent UTI. EXAM: CT ABDOMEN AND PELVIS WITH CONTRAST  TECHNIQUE: Multidetector CT imaging of the abdomen and pelvis was performed using the standard protocol following bolus administration of intravenous contrast. CONTRAST:  ISOVUE-300 IOPAMIDOL (ISOVUE-300) INJECTION 61% COMPARISON:  None. FINDINGS: Lower chest: Calcified granuloma over the left lower lobe as lung bases otherwise within normal. Small hiatal hernia. Hepatobiliary: Within normal. Pancreas: Oval cystic lesion over the pancreatic tail measuring 1.2 x 2.1 cm. Centered over the main pancreatic duct. Spleen: A few calcified granulomas present. Adrenals/Urinary Tract: Adrenal glands are normal. Kidneys are normal in size without hydronephrosis or nephrolithiasis. No focal renal mass. Visualized portions of the ureters are within normal. The distal ureters are difficult to visualize as is the entire bladder due to streak artifact from adjacent hardware over the hips. Stomach/Bowel: Small hiatal hernia. Stomach and small bowel are otherwise unremarkable. Appendix is within normal. There is mild diffuse edema/wall thickening involving the ascending colon as well as descending  and rectosigmoid colon suggesting a colitis of infectious or inflammatory nature. Vascular/Lymphatic: Moderate calcified plaque over the abdominal aorta and iliac arteries. No evidence of adenopathy. Reproductive: Within normal. Other: No significant free fluid. Musculoskeletal: Right total hip arthroplasty. Hardware over the left femur intact. Moderate degenerate changes spine with multiple compression fractures involving T11 and T12 as well as L1, L3 and L5 likely chronic, but age indeterminate. Mild degenerate change of the left hip. IMPRESSION: Mild wall thickening/ submucosal edema involving the ascending and descending/ rectosigmoid colon likely an acute colitis due to infectious or inflammatory nature. 2 cm cystic lesion over the tail of the pancreas centered over the main pancreatic duct. Recommend follow up pre and post contrast MRI/MRCP or pancreatic protocol CT in 2 years. This recommendation follows ACR consensus guidelines: Management of Incidental Pancreatic Cysts: A White Paper of the ACR Incidental Findings Committee. J Am Coll Radiol 2017;14:911-923. Multiple spinal compression fractures as described likely chronic, although age indeterminate. Aortic Atherosclerosis (ICD10-I70.0). Electronically Signed   By: Elberta Fortis M.D.   On: 05/09/2017 15:50    Procedures Procedures (including critical care time)  Medications Ordered in ED Medications  VITAL-D RX TABS 1 mg (not administered)  citalopram (CELEXA) 10 MG/5ML suspension 20 mg (not administered)  loratadine (CLARITIN) tablet 10 mg (not administered)  finasteride (PROSCAR) tablet 5 mg (not administered)  gabapentin (NEURONTIN) capsule 300 mg (not administered)  PROBIATA TABS 1 tablet (not administered)  tamsulosin (FLOMAX) capsule 0.4 mg (not administered)  sodium chloride flush (NS) 0.9 % injection 3 mL (not administered)  0.9 %  sodium chloride infusion (not administered)  acetaminophen (TYLENOL) tablet 650 mg (not administered)     Or  acetaminophen (TYLENOL) suppository 650 mg (not administered)  HYDROcodone-acetaminophen (NORCO/VICODIN) 5-325 MG per tablet 1-2 tablet (not administered)  ondansetron (ZOFRAN) tablet 4 mg (not administered)    Or  ondansetron (ZOFRAN) injection 4 mg (not administered)  sodium chloride 0.9 % bolus 1,000 mL (0 mLs Intravenous Stopped 05/09/17 1452)  iopamidol (ISOVUE-300) 61 % injection 100 mL (100 mLs Intravenous Contrast Given 05/09/17 1516)  ciprofloxacin (CIPRO) IVPB 400 mg (400 mg Intravenous New Bag/Given 05/09/17 1900)  metroNIDAZOLE (FLAGYL) IVPB 500 mg (0 mg Intravenous Stopped 05/09/17 1756)  zinc oxide (BALMEX) 11.3 % cream (1 application  Given 05/09/17 1707)     Initial Impression / Assessment and Plan / ED Course  I have reviewed the triage vital signs and the nursing notes.  Pertinent labs & imaging results that were available during my care of the patient were reviewed by me and considered in  my medical decision making (see chart for details).     82 yo M here with hypotension, bradycardia likely 2/2 vagal episode during large volume BM, complicated by significant clinical dehydration. Pt initially hypotensive but this responded very well to small amount of IVF, with normal LA, normal CBC - doubt sepsis or shock. UA improved significantly from prior. CT A/P shows diffuse colitis. Concern for infectious versus C. Diff colitis. Normal LA, doubt ischemic colitis. Will admit for IVF, further work-up. Pt and family in agreement.  Final Clinical Impressions(s) / ED Diagnoses   Final diagnoses:  Generalized weakness  Other specified hypotension  Diarrhea of presumed infectious origin    New Prescriptions Current Discharge Medication List       Shaune Pollack, MD 05/09/17 2043

## 2017-05-09 NOTE — ED Notes (Signed)
Patient transported to CT 

## 2017-05-09 NOTE — ED Triage Notes (Signed)
Granddaughter of patient states the patient was sitting on the toilet and developed a sudden on set of weakness and hot feeling.  States he has a history of slurred speech, but his speech was more slurred than normal for approximately 45 minutes.  Patient is complaining of lower back pain and was treated for a UTI last week.  Family states the patient has had similar episodes for the last few weeks.  [facility].

## 2017-05-09 NOTE — ED Notes (Signed)
Patient has had 3 bowel movements since arrival to ED. Daughter states he had 2 at home.

## 2017-05-09 NOTE — H&P (Signed)
History and Physical    David [name] WUJ:[id number] DOB: January 16, 1935 DOA: 05/09/2017  PCP: Patient, No Pcp Per   Patient coming from: Home, by way of [facility] ED   Chief Complaint: Diarrhea, loss of appetite, malaise   HPI: David [name] is a 82 y.o. male with medical history significant for dementia, chronic dysarthria, COPD, and depression, now presenting to the emergency department for evaluation of diarrhea with loss of appetite and malaise. Patient is accompanied by his granddaughter who assists with the history. He has reportedly been suffering from intermittent loose stools for over a year now, but had been doing fairly well recently until this morning when he had 2 episodes of diarrhea associated with generalized weakness, loss of appetite, and a nonspecific malaise. Reports transient chills, but no fever. He had a similar episode 10 days ago, was evaluated in the emergency department at that time, he was suspected of having a UTI, and is now status post treatment with Keflex. Denies any significant abdominal pain, but does note some occasional crampy discomfort in lower quadrants. Denies melena or hematochezia, denies any significant nausea, and denies vomiting. No recent long distance travel and no sick contacts.  [facility] ED Course: Upon arrival to the [facility] ED, patient is found to be afebrile, saturating well on room air, tachycardic to 120, and with blood pressure stable. EKG features a sinus rhythm with PVCs and incomplete right bundle branch block. Chest x-ray is negative for acute cardiopulmonary disease. Chemistry panel is notable for a slight hyponatremia and CBC features a normocytic anemia with hemoglobin of 11.0, up from 10.0 earlier this month. Lactic acid is reassuring at 0.90, troponin is undetectable, and occult blood is identified in the stool. CT of the abdomen and pelvis was obtained and notable for mild wall thickening and submucosal edema in the  ascending, descending, and rectosigmoid colon, likely reflecting an acute infectious/inflammatory colitis. Also noted on the CT is a 2 cm cystic lesion over the main pancreatic duct with follow-up imaging advised in 2 years. Blood and urine cultures were obtained, 1 L of normal saline was given, stool was sent for C. difficile and GI pathogen panel. Patient was treated with empiric ciprofloxacin and Flagyl in the ED. He remained hemodynamically stable and no apparent respiratory distress and was accepted in transfer to [facility] for admission to the telemetry unit.  Review of Systems:  All other systems reviewed and apart from HPI, are negative.  Past Medical History:  Diagnosis Date  . Aspiration into airway   . COPD (chronic obstructive pulmonary disease) (HCC)   . Pneumonia   . Stroke [facility])    TIA's per CT scan    Past Surgical History:  Procedure Laterality Date  . JOINT REPLACEMENT Bilateral    Hip replacemen     reports that he has quit smoking. He has never used smokeless tobacco. He reports that he does not drink alcohol or use drugs.  No Known Allergies  History reviewed. No pertinent family history.   Prior to Admission medications   Medication Sig Start Date End Date Taking? Authorizing Provider  B Complex-C-Biotin-D-Zinc-FA (VITAL-D RX PO) Take by mouth.   Yes [provider]  citalopram (CELEXA) 10 MG/5ML suspension Take 20 mg by mouth daily.   Yes [provider]  Desloratadine (CLARINEX PO) Take by mouth.   Yes [provider]  finasteride (PROSCAR) 5 MG tablet Take 5 mg by mouth daily.   Yes [provider]  gabapentin (NEURONTIN) 300 MG capsule Take 300 mg by mouth 3 (three) times daily.   Yes [provider]  Lactobacillus (PROBIATA PO) Take by mouth.   Yes [provider]  Melatonin-Pyridoxine (MELATONIN-B6 PO) Take by mouth.   Yes [provider]  Multiple Vitamin (MULTIVITAMIN) capsule  Take 1 capsule by mouth daily.   Yes [provider]  tamsulosin (FLOMAX) 0.4 MG CAPS capsule Take 0.4 mg by mouth.   Yes [provider]  cephALEXin (KEFLEX) 500 MG capsule Take 1 capsule (500 mg total) by mouth 2 (two) times daily. 04/29/17   Roxy Horseman, PA-C    Physical Exam: Vitals:   05/09/17 1305 05/09/17 1542 05/09/17 1610 05/09/17 1841  BP:  117/73 115/67 (!) 121/91  Pulse:  (!) 103 68 (!) 118  Resp:  15 17 16   Temp:    97.9 F (36.6 C)  TempSrc:    Oral  SpO2:  (!) 73% 95% 98%  Weight: 61.2 kg (135 lb)   63.8 kg (140 lb 10.5 oz)      Constitutional: No acute distress, chronically-ill appearing, no diaphoresis.  Eyes: PERTLA, lids and conjunctivae normal ENMT: Mucous membranes are moist. Posterior pharynx clear of any exudate or lesions.   Neck: normal, supple, no masses, no thyromegaly Respiratory: clear to auscultation bilaterally, no wheezing, no crackles. Normal respiratory effort.   Cardiovascular: Rate ~80 and regular. No extremity edema. No significant JVD. Abdomen: No distension, no tenderness, no masses palpated. Bowel sounds active.  Musculoskeletal: no clubbing / cyanosis. No joint deformity upper and lower extremities.   Skin: no significant rashes, lesions, ulcers. Warm, dry, well-perfused. Poor turgor.  Neurologic: No gross facial asymmetry, moving all extremities spontaneously. Patellar DTR's normal.  Psychiatric: Alert and oriented to person, place, and situation, but not oriented to month or year. Pleasant and cooperative.     Labs on Admission: I have personally reviewed following labs and imaging studies  CBC:  Recent Labs Lab 05/09/17 1320  WBC 10.1  NEUTROABS 8.5*  HGB 11.0*  HCT 32.1*  MCV 90.7  PLT 170   Basic Metabolic Panel:  Recent Labs Lab 05/09/17 1320  NA 134*  K 3.7  CL 102  CO2 24  GLUCOSE 123*  BUN 16  CREATININE 1.04  CALCIUM 8.7*   GFR: Estimated Creatinine Clearance: 51.1 mL/min (by C-G  formula based on SCr of 1.04 mg/dL). Liver Function Tests:  Recent Labs Lab 05/09/17 1320  AST 17  ALT 12*  ALKPHOS 59  BILITOT 0.5  PROT 6.3*  ALBUMIN 3.7    Recent Labs Lab 05/09/17 1320  LIPASE 27   No results for input(s): AMMONIA in the last 168 hours. Coagulation Profile: No results for input(s): INR, PROTIME in the last 168 hours. Cardiac Enzymes:  Recent Labs Lab 05/09/17 1321  TROPONINI <0.03   BNP (last 3 results) No results for input(s): PROBNP in the last 8760 hours. HbA1C: No results for input(s): HGBA1C in the last 72 hours. CBG: No results for input(s): GLUCAP in the last 168 hours. Lipid Profile: No results for input(s): CHOL, HDL, LDLCALC, TRIG, CHOLHDL, LDLDIRECT in the last 72 hours. Thyroid Function Tests: No results for input(s): TSH, T4TOTAL, FREET4, T3FREE, THYROIDAB in the last 72 hours. Anemia Panel: No results for input(s): VITAMINB12, FOLATE, FERRITIN, TIBC, IRON, RETICCTPCT in the last 72 hours. Urine analysis:    Component Value Date/Time   COLORURINE YELLOW 05/09/2017 1322   APPEARANCEUR CLEAR 05/09/2017 1322   LABSPEC 1.012  05/09/2017 1322   PHURINE 5.5 05/09/2017 1322   GLUCOSEU NEGATIVE 05/09/2017 1322   HGBUR NEGATIVE 05/09/2017 1322   BILIRUBINUR NEGATIVE 05/09/2017 1322   KETONESUR NEGATIVE 05/09/2017 1322   PROTEINUR NEGATIVE 05/09/2017 1322   NITRITE NEGATIVE 05/09/2017 1322   LEUKOCYTESUR TRACE (A) 05/09/2017 1322   Sepsis Labs: @LABRCNTIP (procalcitonin:4,lacticidven:4) )No results found for this or any previous visit (from the past 240 hour(s)).   Radiological Exams on Admission: Dg Chest 2 View  Result Date: 05/09/2017 CLINICAL DATA:  Sudden onset weakness. EXAM: CHEST  2 VIEW COMPARISON:  04/29/2017 FINDINGS: AP and lateral views of the chest show hyperexpansion with chronic interstitial coarsening. No focal airspace consolidation. No pulmonary edema or pleural effusion. The cardiopericardial silhouette is within  normal limits for size. Lower thoracic compression fracture is similar to prior. Telemetry leads overlie the chest. IMPRESSION: Stable.  Emphysema without acute cardiopulmonary findings. Electronically Signed   By: Kennith Center M.D.   On: 05/09/2017 15:58   Ct Abdomen Pelvis W Contrast  Result Date: 05/09/2017 CLINICAL DATA:  Lower abdominal pain and diarrhea today. Recent UTI. EXAM: CT ABDOMEN AND PELVIS WITH CONTRAST TECHNIQUE: Multidetector CT imaging of the abdomen and pelvis was performed using the standard protocol following bolus administration of intravenous contrast. CONTRAST:  ISOVUE-300 IOPAMIDOL (ISOVUE-300) INJECTION 61% COMPARISON:  None. FINDINGS: Lower chest: Calcified granuloma over the left lower lobe as lung bases otherwise within normal. Small hiatal hernia. Hepatobiliary: Within normal. Pancreas: Oval cystic lesion over the pancreatic tail measuring 1.2 x 2.1 cm. Centered over the main pancreatic duct. Spleen: A few calcified granulomas present. Adrenals/Urinary Tract: Adrenal glands are normal. Kidneys are normal in size without hydronephrosis or nephrolithiasis. No focal renal mass. Visualized portions of the ureters are within normal. The distal ureters are difficult to visualize as is the entire bladder due to streak artifact from adjacent hardware over the hips. Stomach/Bowel: Small hiatal hernia. Stomach and small bowel are otherwise unremarkable. Appendix is within normal. There is mild diffuse edema/wall thickening involving the ascending colon as well as descending and rectosigmoid colon suggesting a colitis of infectious or inflammatory nature. Vascular/Lymphatic: Moderate calcified plaque over the abdominal aorta and iliac arteries. No evidence of adenopathy. Reproductive: Within normal. Other: No significant free fluid. Musculoskeletal: Right total hip arthroplasty. Hardware over the left femur intact. Moderate degenerate changes spine with multiple compression fractures  involving T11 and T12 as well as L1, L3 and L5 likely chronic, but age indeterminate. Mild degenerate change of the left hip. IMPRESSION: Mild wall thickening/ submucosal edema involving the ascending and descending/ rectosigmoid colon likely an acute colitis due to infectious or inflammatory nature. 2 cm cystic lesion over the tail of the pancreas centered over the main pancreatic duct. Recommend follow up pre and post contrast MRI/MRCP or pancreatic protocol CT in 2 years. This recommendation follows ACR consensus guidelines: Management of Incidental Pancreatic Cysts: A White Paper of the ACR Incidental Findings Committee. J Am Coll Radiol 2017;14:911-923. Multiple spinal compression fractures as described likely chronic, although age indeterminate. Aortic Atherosclerosis (ICD10-I70.0). Electronically Signed   By: Elberta Fortis M.D.   On: 05/09/2017 15:50    EKG: Independently reviewed. Sinus rhythm, PVC's, low-voltage QRS, incomplete RBBB. No priors available.   Assessment/Plan  1. Colitis  - Pt presents with diarrhea, malaise, brief episode of chills, loss of appetite  - Has been on Keflex recently for suspected UTI   - CT findings suggest infectious colitis; no fever or leukocytosis, normal lactate  - Manson Passey  stool, positive for occult blood  - Blood culture, urine culture, C diff assay, and GI pathogen panel collected in ED  - Continue empiric abx with Cipro and Flagyl, supportive care with IVF, prn analgesia and antiemetics, maintain enteric precautions    2. Hyponatremia  - Mild, hypovolemic in setting of anorexia and diarrhea  - Given 1 liter NS in ED, will continue IVF overnight, repeat chem panel in am    3. Depression - Worse since death of his wife last month, but seems to be coping fairly well with good family-support; had been married 59 years  - Continue Celexa    4. Anemia, occult blood in stool  - Hgb is 11.0 on admission, up from 10.0 earlier in the month - No gross blood in  stool, but FOBT+  - Likely secondary to acute colitis, eval and tx as above  - VTE ppx with SCD's and repeat CBC in am    5. Incidental pancreatic lesion  - Noted on CT abd/pelvis  - Follow-up imaging in 2 years recommended as appropriate     DVT prophylaxis: SCD's  Code Status: Full  Family Communication: Granddaughter updated at bedside Disposition Plan: Admit to telemetry Consults called: None Admission status: Inpatient    David Deutscherimothy S Opyd, MD [facility] Pager [phone]  If 7PM-7AM, please contact night-coverage [email or web address] Password [email or web address]  05/09/2017, 8:05 PM

## 2017-05-10 ENCOUNTER — Inpatient Hospital Stay (HOSPITAL_COMMUNITY): Payer: Medicare Other

## 2017-05-10 DIAGNOSIS — R410 Disorientation, unspecified: Secondary | ICD-10-CM

## 2017-05-10 DIAGNOSIS — F0390 Unspecified dementia without behavioral disturbance: Secondary | ICD-10-CM

## 2017-05-10 LAB — TROPONIN I
Troponin I: <0.03 ng/mL (ref <0.03)
Troponin I: <0.03 ng/mL (ref <0.03)

## 2017-05-10 LAB — GASTROINTESTINAL PANEL BY PCR, STOOL (REPLACES STOOL CULTURE)
ADENOVIRUS F40/41: NOT DETECTED
Astrovirus: NOT DETECTED
CAMPYLOBACTER SPECIES: NOT DETECTED
CRYPTOSPORIDIUM: NOT DETECTED
CYCLOSPORA CAYETANENSIS: NOT DETECTED
ENTEROPATHOGENIC E COLI (EPEC): NOT DETECTED
Entamoeba histolytica: NOT DETECTED
Enteroaggregative E coli (EAEC): NOT DETECTED
Enterotoxigenic E coli (ETEC): NOT DETECTED
Giardia lamblia: NOT DETECTED
Norovirus GI/GII: NOT DETECTED
PLESIMONAS SHIGELLOIDES: NOT DETECTED
ROTAVIRUS A: NOT DETECTED
SAPOVIRUS (I, II, IV, AND V): NOT DETECTED
SHIGA LIKE TOXIN PRODUCING E COLI (STEC): NOT DETECTED
SHIGELLA/ENTEROINVASIVE E COLI (EIEC): NOT DETECTED
Salmonella species: NOT DETECTED
Vibrio cholerae: NOT DETECTED
Vibrio species: NOT DETECTED
YERSINIA ENTEROCOLITICA: NOT DETECTED

## 2017-05-10 LAB — CBC
HEMATOCRIT: 35.2 % — AB (ref 39.0–52.0)
Hemoglobin: 12 g/dL — ABNORMAL LOW (ref 13.0–17.0)
MCH: 30.5 pg (ref 26.0–34.0)
MCHC: 34.1 g/dL (ref 30.0–36.0)
MCV: 89.6 fL (ref 78.0–100.0)
PLATELETS: 190 10*3/uL (ref 150–400)
RBC: 3.93 MIL/uL — AB (ref 4.22–5.81)
RDW: 13.9 % (ref 11.5–15.5)
WBC: 21.4 10*3/uL — ABNORMAL HIGH (ref 4.0–10.5)

## 2017-05-10 LAB — CBC WITH DIFFERENTIAL/PLATELET
BASOS ABS: 0 10*3/uL (ref 0.0–0.1)
BASOS PCT: 0 %
Eosinophils Absolute: 0 10*3/uL (ref 0.0–0.7)
Eosinophils Relative: 0 %
HEMATOCRIT: 34.3 % — AB (ref 39.0–52.0)
Hemoglobin: 11.7 g/dL — ABNORMAL LOW (ref 13.0–17.0)
LYMPHS PCT: 10 %
Lymphs Abs: 1.9 10*3/uL (ref 0.7–4.0)
MCH: 30.4 pg (ref 26.0–34.0)
MCHC: 34.1 g/dL (ref 30.0–36.0)
MCV: 89.1 fL (ref 78.0–100.0)
Monocytes Absolute: 1.5 10*3/uL — ABNORMAL HIGH (ref 0.1–1.0)
Monocytes Relative: 8 %
NEUTROS ABS: 15.4 10*3/uL — AB (ref 1.7–7.7)
NEUTROS PCT: 82 %
Platelets: 198 10*3/uL (ref 150–400)
RBC: 3.85 MIL/uL — AB (ref 4.22–5.81)
RDW: 13.7 % (ref 11.5–15.5)
WBC: 18.8 10*3/uL — AB (ref 4.0–10.5)

## 2017-05-10 LAB — BASIC METABOLIC PANEL
ANION GAP: 8 (ref 5–15)
ANION GAP: 7 (ref 5–15)
BUN: 12 mg/dL (ref 6–20)
BUN: 12 mg/dL (ref 6–20)
CHLORIDE: 108 mmol/L (ref 101–111)
CO2: 22 mmol/L (ref 22–32)
CO2: 21 mmol/L — AB (ref 22–32)
Calcium: 8 mg/dL — ABNORMAL LOW (ref 8.9–10.3)
Calcium: 7.8 mg/dL — ABNORMAL LOW (ref 8.9–10.3)
Chloride: 105 mmol/L (ref 101–111)
Creatinine, Ser: 0.89 mg/dL (ref 0.61–1.24)
Creatinine, Ser: 0.96 mg/dL (ref 0.61–1.24)
GFR calc Af Amer: >60 mL/min (ref >60)
GLUCOSE: 136 mg/dL — AB (ref 65–99)
GLUCOSE: 143 mg/dL — AB (ref 65–99)
POTASSIUM: 3.3 mmol/L — AB (ref 3.5–5.1)
POTASSIUM: 4 mmol/L (ref 3.5–5.1)
SODIUM: 135 mmol/L (ref 135–145)
Sodium: 136 mmol/L (ref 135–145)

## 2017-05-10 LAB — BLOOD GAS, ARTERIAL
ACID-BASE DEFICIT: 4.7 mmol/L — AB (ref 0.0–2.0)
Bicarbonate: 19.6 mmol/L — ABNORMAL LOW (ref 20.0–28.0)
Drawn by: 441371
O2 Content: 2 L/min
O2 Saturation: 96.9 %
PH ART: 7.367 (ref 7.350–7.450)
Patient temperature: 97.6
pCO2 arterial: 34.8 mmHg (ref 32.0–48.0)
pO2, Arterial: 94.6 mmHg (ref 83.0–108.0)

## 2017-05-10 LAB — MRSA PCR SCREENING: MRSA BY PCR: NEGATIVE

## 2017-05-10 LAB — LACTIC ACID, PLASMA
Lactic Acid, Venous: 2.6 mmol/L (ref 0.5–1.9)
Lactic Acid, Venous: 3.3 mmol/L (ref 0.5–1.9)

## 2017-05-10 LAB — C DIFFICILE QUICK SCREEN W PCR REFLEX
C DIFFICILE (CDIFF) INTERP: DETECTED
C Diff antigen: POSITIVE — AB
C Diff toxin: POSITIVE — AB

## 2017-05-10 LAB — GLUCOSE, CAPILLARY
GLUCOSE-CAPILLARY: 126 mg/dL — AB (ref 65–99)
GLUCOSE-CAPILLARY: 144 mg/dL — AB (ref 65–99)

## 2017-05-10 LAB — AMMONIA: AMMONIA: 15 umol/L (ref 9–35)

## 2017-05-10 LAB — VITAMIN B12: VITAMIN B 12: 578 pg/mL (ref 180–914)

## 2017-05-10 LAB — TSH: TSH: 3.383 u[IU]/mL (ref 0.350–4.500)

## 2017-05-10 MED ORDER — SODIUM CHLORIDE 0.9 % IV BOLUS (SEPSIS)
500.0000 mL | Freq: Once | INTRAVENOUS | Status: AC
  Administered 2017-05-10: 500 mL via INTRAVENOUS

## 2017-05-10 MED ORDER — IPRATROPIUM-ALBUTEROL 0.5-2.5 (3) MG/3ML IN SOLN
3.0000 mL | Freq: Four times a day (QID) | RESPIRATORY_TRACT | Status: DC
  Administered 2017-05-10 (×3): 3 mL via RESPIRATORY_TRACT
  Filled 2017-05-10 (×4): qty 3

## 2017-05-10 MED ORDER — DEXTROSE-NACL 5-0.9 % IV SOLN
INTRAVENOUS | Status: DC
Start: 2017-05-10 — End: 2017-05-15
  Administered 2017-05-10 – 2017-05-14 (×9): via INTRAVENOUS

## 2017-05-10 MED ORDER — BUDESONIDE 0.25 MG/2ML IN SUSP
0.2500 mg | Freq: Two times a day (BID) | RESPIRATORY_TRACT | Status: DC
  Administered 2017-05-10 – 2017-05-11 (×3): 0.25 mg via RESPIRATORY_TRACT
  Filled 2017-05-10 (×3): qty 2

## 2017-05-10 MED ORDER — IPRATROPIUM-ALBUTEROL 0.5-2.5 (3) MG/3ML IN SOLN
3.0000 mL | Freq: Three times a day (TID) | RESPIRATORY_TRACT | Status: DC
  Administered 2017-05-11: 3 mL via RESPIRATORY_TRACT
  Filled 2017-05-10: qty 3

## 2017-05-10 MED ORDER — SODIUM CHLORIDE 0.9 % IV SOLN: INTRAVENOUS | Status: DC

## 2017-05-10 MED ORDER — VANCOMYCIN 50 MG/ML ORAL SOLUTION
125.0000 mg | Freq: Four times a day (QID) | ORAL | Status: DC
  Administered 2017-05-10: 125 mg via ORAL
  Filled 2017-05-10 (×3): qty 2.5

## 2017-05-10 MED ORDER — POTASSIUM CHLORIDE 10 MEQ/100ML IV SOLN
10.0000 meq | INTRAVENOUS | Status: AC
Start: 2017-05-10 — End: 2017-05-10
  Administered 2017-05-10 (×3): 10 meq via INTRAVENOUS
  Filled 2017-05-10 (×3): qty 100

## 2017-05-10 MED ORDER — VANCOMYCIN 50 MG/ML ORAL SOLUTION: 500.0000 mg | Freq: Four times a day (QID) | ORAL | Status: DC

## 2017-05-10 MED ORDER — VANCOMYCIN 50 MG/ML ORAL SOLUTION
500.0000 mg | Freq: Four times a day (QID) | ORAL | Status: DC
  Administered 2017-05-10 – 2017-05-16 (×24): 500 mg via ORAL
  Filled 2017-05-10 (×27): qty 10

## 2017-05-10 NOTE — Consult Note (Addendum)
Neurology Consultation Reason for Consult: Altered mental status Referring Physician: Regalado, B  CC: Altered mental status  History is obtained from: Patient, chart  HPI: Armida SansRobert E Fieldhouse is a 82 y.o. male with a history of dementia who presents following urinary tract infection with treatment with Keflex. He has now developed generalized weakness, lethargy, diarrhea and is suspected to have C. difficile colitis. Setting, he has developed confusion and therefore neurology has been asked to evaluate him. He apparently was doing better yesterday, but family noticed today that he wasn't always recognizing them that he is able to follow commands and answer questions.   ROS: A 14 point ROS was performed and is negative except as noted in the HPI.  Past Medical History:  Diagnosis Date  . Aspiration into airway   . COPD (chronic obstructive pulmonary disease) (HCC)   . Pneumonia   . Stroke [facility](HCC)    TIA's per CT scan     Family history: Unable to obtain due to altered mental status   Social History:  reports that he has quit smoking. He has never used smokeless tobacco. He reports that he does not drink alcohol or use drugs.   Exam: Current vital signs: BP 113/87 (BP Location: Right Arm)   Pulse 76   Temp 97.9 F (36.6 C) (Axillary)   Resp 20   Ht 5\' 6"  (1.676 m)   Wt 61.2 kg (134 lb 14.7 oz)   SpO2 100%   BMI 21.78 kg/m  Vital signs in last 24 hours: Temp:  [97.6 F (36.4 C)-97.9 F (36.6 C)] 97.9 F (36.6 C) (07/22 1527) Pulse Rate:  [56-118] 76 (07/22 1522) Resp:  [15-20] 20 (07/22 1348) BP: (112-140)/(75-91) 113/87 (07/22 1348) SpO2:  [86 %-100 %] 100 % (07/22 1522) FiO2 (%):  [98 %] 98 % (07/22 1600) Weight:  [61.2 kg (134 lb 14.7 oz)-63.8 kg (140 lb 10.5 oz)] 61.2 kg (134 lb 14.7 oz) (07/22 1527)   Physical Exam  Constitutional: Appears elderly Psych: Affect appropriate to situation Eyes: No scleral injection HENT: No OP obstrucion Head: Normocephalic.   Cardiovascular: Normal rate and regular rhythm.  Respiratory: Effort normal and breath sounds normal to anterior ascultation GI: Soft.  No distension. There is no tenderness.  Skin: WDI  Neuro: Mental Status: Patient is awake, alert, he is not oriented but is able to answer other questions. Patient is able to give a clear and coherent history. No signs of aphasia or neglect He has mild dysarthria which apparently is chronic Cranial Nerves: II: Visual Fields are full. Pupils are equal, round, and reactive to light.   III,IV, VI: EOMI without ptosis or diploplia.  V: Facial sensation is symmetric to temperature VII: Facial movement is symmetric.  VIII: hearing is intact to voice X: Uvula elevates symmetrically XI: Shoulder shrug is symmetric. XII: tongue is midline without atrophy or fasciculations.  Motor: Tone is normal. Bulk is normal. He gives poor effort, but has symmetric strength was 4+/5 throughout, mild asterixis with positive myoclonus as well. Sensory: Sensation is symmetric to light touch and temperature in the arms and legs. Cerebellar: He has asterixis bilaterally with some positive myoclonus as well.   I have reviewed labs in epic and the results pertinent to this consultation are: Ammonia-normal  I have reviewed the images obtained: CT head-unremarkable  Impression: 82 year old male with dementia and altered mental status in the setting of C. difficile colitis. I suspect that he has a mild delirium secondary to GI infection in the  setting of dementia. Cipro can also contribute to mental status changes as well as asterixis/myoclonus and this may have played a role in the movements, but has been discontinued. I suspect this will improve as his overall medical condition improves.  Recommendations: 1) continue supportive care and treatment of infections per internal medicine. 2) TSH, treat if hypothyroidism was found  3) no further recommendations at this time,  please call with further questions or concerns.   Ritta Slot, MD [facility] [phone]  If 7pm- 7am, please page neurology on call as listed in AMION.

## 2017-05-10 NOTE — Progress Notes (Addendum)
PROGRESS NOTE    David [name]  JSE:[id number] DOB: 1934-11-12 DOA: 05/09/2017 PCP: Patient, No Pcp Per    Brief Narrative: David [name] is a 82 y.o. male with medical history significant for dementia, chronic dysarthria, COPD, and depression, now presenting to the emergency department for evaluation of diarrhea with loss of appetite and malaise. Patient is accompanied by his granddaughter who assists with the history. He has reportedly been suffering from intermittent loose stools for over a year now, but had been doing fairly well recently until this morning when he had 2 episodes of diarrhea associated with generalized weakness, loss of appetite, and a nonspecific malaise. Reports transient chills, but no fever. He had a similar episode 10 days ago, was evaluated in the emergency department at that time, he was suspected of having a UTI, and is now status post treatment with Keflex.    Assessment & Plan:   Principal Problem:   Colitis Active Problems:   Depression   Hyponatremia   Normocytic anemia   Dementia   Pancreatic lesion   Colitis presumed infectious   Occult blood positive stool   Diarrhea of presumed infectious origin   Colitis, C diff positive - Pt presents with diarrhea, malaise, brief episode of chills, loss of appetite  - Has been on Keflex recently for suspected UTI   - CT findings suggest infectious colitis; no fever or leukocytosis, normal lactate  - Brown stool, positive for occult blood  - Blood culture, urine culture, C diff assay, and GI pathogen panel collected in ED  -C diff positive. Started oral vancomycin. Discontinue cipro.    Acute hypoxic respiratory failure;  Concern with aspiration, will make him NP except for meds.  Swallow evaluation.  Check chest x ray.  Nebulizer treatment.   AMS;  Called by family that patient was notice to be confused. Not at baseline, he is more lethargic.  Patient is more lethargic now, he would wake up,  fall back to sleep. He follows some command. He was able to tell me his name with slurred speech. He didn't recognized granddaughter, his motor strength upper extremity 5/5, lower extremity 5/5. He has some tremors that is new per family.  Plan;  -Stat ABG. Was negative for hypercapnia.  -Start CT head. Will get EEG.  -Transfer to step down unit. Frequent neuro check.  -His blood sugar was check and it was 144.  -check EKG, CBC, b-met, troponin.  -suspect delirium. Of note he received Zofran that can cause  -check ammonia, B-12.  -neurology consulted.  -will hold gabapentin.   Leukocytosis; related to C diff.   Hyponatremia  - improved with IV fluids.   Hypokalemia; replete IV  . Depression - Worse since death of his wife last month. He has good family support.  - Continue Celexa    . Anemia, occult blood in stool ; hb stable.  - Hgb is 11.0 on admission, up from 10.0 earlier in the month - No gross blood in stool, but FOBT+  - Likely secondary to acute colitis, eval and tx as above  - VTE ppx with SCD's and repeat CBC in am    . Incidental pancreatic lesion  - Noted on CT abd/pelvis  - Follow-up imaging in 2 years recommended as appropriate      DVT prophylaxis: SCD, occult blood positive Code Status: full code.  Family Communication: no family at bedside.  Disposition Plan: to be determine.    Consultants:   none  Procedures:   none   Antimicrobials:   Vancomycin oral and flagyl.    Subjective: He is sleepy, he relates  he couldn't sleep last night. He wake up, denied dyspnea, answer some questions, follows command,  He denies dyspnea, he appears to have nausea or reflux,  during my evaluation.    Objective: Vitals:   05/09/17 1841 05/09/17 2056 05/10/17 0622 05/10/17 0920  BP: (!) 121/91 140/80 112/75   Pulse: (!) 118 67 (!) 114   Resp: 16 15 18    Temp: 97.9 F (36.6 C) 97.8 F (36.6 C) 97.6 F (36.4 C)   TempSrc: Oral Oral Oral   SpO2:  98% 98% (!) 86% 99%  Weight: 63.8 kg (140 lb 10.5 oz)  62.1 kg (136 lb 14.5 oz)   Height:   5' 6"  (1.676 m)     Intake/Output Summary (Last 24 hours) at 05/10/17 1004 Last data filed at 05/10/17 0847  Gross per 24 hour  Intake             1845 ml  Output              200 ml  Net             1645 ml   Filed Weights   05/09/17 1305 05/09/17 1841 05/10/17 0622  Weight: 61.2 kg (135 lb) 63.8 kg (140 lb 10.5 oz) 62.1 kg (136 lb 14.5 oz)    Examination:  General exam: Appears calm and comfortable  Respiratory system: decrease breath sound. Respiratory effort normal. Cardiovascular system: S1 & S2 heard, RRR. No JVD, murmurs, rubs, gallops or clicks. No pedal edema. Gastrointestinal system: Abdomen is nondistended, soft and nontender. No organomegaly or masses felt. Normal bowel sounds heard. Central nervous system: Lethargic, wake up transiently , follows some command, upper extremities 5/5 lower extremities 5/5, speech slurred  Extremities: Symmetric 5 x 5 power.      Data Reviewed: I have personally reviewed following labs and imaging studies  CBC:  Recent Labs Lab 05/09/17 1320 05/10/17 0531  WBC 10.1 18.8*  NEUTROABS 8.5* 15.4*  HGB 11.0* 11.7*  HCT 32.1* 34.3*  MCV 90.7 89.1  PLT 170 638   Basic Metabolic Panel:  Recent Labs Lab 05/09/17 1320 05/10/17 0531  NA 134* 135  K 3.7 3.3*  CL 102 105  CO2 24 22  GLUCOSE 123* 136*  BUN 16 12  CREATININE 1.04 0.89  CALCIUM 8.7* 8.0*   GFR: Estimated Creatinine Clearance: 58.1 mL/min (by C-G formula based on SCr of 0.89 mg/dL). Liver Function Tests:  Recent Labs Lab 05/09/17 1320  AST 17  ALT 12*  ALKPHOS 59  BILITOT 0.5  PROT 6.3*  ALBUMIN 3.7    Recent Labs Lab 05/09/17 1320  LIPASE 27   No results for input(s): AMMONIA in the last 168 hours. Coagulation Profile: No results for input(s): INR, PROTIME in the last 168 hours. Cardiac Enzymes:  Recent Labs Lab 05/09/17 1321  TROPONINI <0.03    BNP (last 3 results) No results for input(s): PROBNP in the last 8760 hours. HbA1C: No results for input(s): HGBA1C in the last 72 hours. CBG:  Recent Labs Lab 05/10/17 0745  GLUCAP 126*   Lipid Profile: No results for input(s): CHOL, HDL, LDLCALC, TRIG, CHOLHDL, LDLDIRECT in the last 72 hours. Thyroid Function Tests: No results for input(s): TSH, T4TOTAL, FREET4, T3FREE, THYROIDAB in the last 72 hours. Anemia Panel: No results for input(s): VITAMINB12, FOLATE, FERRITIN, TIBC, IRON, RETICCTPCT in the last 72  hours. Sepsis Labs:  Recent Labs Lab 05/09/17 1350  LATICACIDVEN 0.90    Recent Results (from the past 240 hour(s))  C difficile quick scan w PCR reflex     Status: Abnormal   Collection Time: 05/09/17  3:00 PM  Result Value Ref Range Status   C Diff antigen POSITIVE (A) NEGATIVE Final   C Diff toxin POSITIVE (A) NEGATIVE Final   C Diff interpretation Toxin producing C. difficile detected.  Final    Comment: CRITICAL RESULT CALLED TO, READ BACK BY AND VERIFIED WITH: D. [id number] 1935 07.22.2018 N. MORRIS          Radiology Studies: Dg Chest 2 View  Result Date: 05/09/2017 CLINICAL DATA:  Sudden onset weakness. EXAM: CHEST  2 VIEW COMPARISON:  04/29/2017 FINDINGS: AP and lateral views of the chest show hyperexpansion with chronic interstitial coarsening. No focal airspace consolidation. No pulmonary edema or pleural effusion. The cardiopericardial silhouette is within normal limits for size. Lower thoracic compression fracture is similar to prior. Telemetry leads overlie the chest. IMPRESSION: Stable.  Emphysema without acute cardiopulmonary findings. Electronically Signed   By: Misty Stanley M.D.   On: 05/09/2017 15:58   Ct Abdomen Pelvis W Contrast  Result Date: 05/09/2017 CLINICAL DATA:  Lower abdominal pain and diarrhea today. Recent UTI. EXAM: CT ABDOMEN AND PELVIS WITH CONTRAST TECHNIQUE: Multidetector CT imaging of the abdomen and pelvis was performed  using the standard protocol following bolus administration of intravenous contrast. CONTRAST:  183m ISOVUE-300 IOPAMIDOL (ISOVUE-300) INJECTION 61% COMPARISON:  None. FINDINGS: Lower chest: Calcified granuloma over the left lower lobe as lung bases otherwise within normal. Small hiatal hernia. Hepatobiliary: Within normal. Pancreas: Oval cystic lesion over the pancreatic tail measuring 1.2 x 2.1 cm. Centered over the main pancreatic duct. Spleen: A few calcified granulomas present. Adrenals/Urinary Tract: Adrenal glands are normal. Kidneys are normal in size without hydronephrosis or nephrolithiasis. No focal renal mass. Visualized portions of the ureters are within normal. The distal ureters are difficult to visualize as is the entire bladder due to streak artifact from adjacent hardware over the hips. Stomach/Bowel: Small hiatal hernia. Stomach and small bowel are otherwise unremarkable. Appendix is within normal. There is mild diffuse edema/wall thickening involving the ascending colon as well as descending and rectosigmoid colon suggesting a colitis of infectious or inflammatory nature. Vascular/Lymphatic: Moderate calcified plaque over the abdominal aorta and iliac arteries. No evidence of adenopathy. Reproductive: Within normal. Other: No significant free fluid. Musculoskeletal: Right total hip arthroplasty. Hardware over the left femur intact. Moderate degenerate changes spine with multiple compression fractures involving T11 and T12 as well as L1, L3 and L5 likely chronic, but age indeterminate. Mild degenerate change of the left hip. IMPRESSION: Mild wall thickening/ submucosal edema involving the ascending and descending/ rectosigmoid colon likely an acute colitis due to infectious or inflammatory nature. 2 cm cystic lesion over the tail of the pancreas centered over the main pancreatic duct. Recommend follow up pre and post contrast MRI/MRCP or pancreatic protocol CT in 2 years. This recommendation  follows ACR consensus guidelines: Management of Incidental Pancreatic Cysts: A White Paper of the ACR Incidental Findings Committee. [id number] Multiple spinal compression fractures as described likely chronic, although age indeterminate. Aortic Atherosclerosis (ICD10-I70.0). Electronically Signed   By: DMarin OlpM.D.   On: 05/09/2017 15:50        Scheduled Meds: . budesonide (PULMICORT) nebulizer solution  0.25 mg Nebulization BID  . citalopram  20 mg Oral Daily  .  finasteride  5 mg Oral Daily  . gabapentin  300 mg Oral TID  . ipratropium-albuterol  3 mL Nebulization Q6H  . loratadine  10 mg Oral Daily  . multivitamin with minerals  1 tablet Oral Daily  . saccharomyces boulardii  250 mg Oral Daily  . sodium chloride flush  3 mL Intravenous Q12H  . tamsulosin  0.4 mg Oral QPC breakfast  . vancomycin  125 mg Oral Q6H   Continuous Infusions: . sodium chloride 75 mL/hr at 05/10/17 0852  . metronidazole 500 mg (05/10/17 0853)  . potassium chloride 10 mEq (05/10/17 0853)     LOS: 1 day    Time spent: 35 minutes.     Elmarie Shiley, MD [facility] Pager [phone]  If 7PM-7AM, please contact night-coverage [email or web address] Password [facility] 05/10/2017, 10:04 AM

## 2017-05-10 NOTE — Progress Notes (Signed)
CRITICAL VALUE ALERT  Critical Value:  C-Diff Positive  Date & Time Notied:  05/10/17  0750  Provider Notified: Regaldo  Orders Received/Actions taken: waiting for response

## 2017-05-10 NOTE — Evaluation (Signed)
Clinical/Bedside Swallow Evaluation Patient Details  Name: David [name] MRN: [id number] Date of Birth: [birth year]  Today's Date: 05/10/2017 Time: SLP Start Time (ACUTE ONLY): 1610 SLP Stop Time (ACUTE ONLY): 1625 SLP Time Calculation (min) (ACUTE ONLY): 15 min  Past Medical History:  Past Medical History:  Diagnosis Date  . Aspiration into airway   . COPD (chronic obstructive pulmonary disease) (HCC)   . Pneumonia   . Stroke [facility])    TIA's per CT scan   Past Surgical History:  Past Surgical History:  Procedure Laterality Date  . JOINT REPLACEMENT Bilateral    Hip replacemen   HPI:  82 y.o.malewith medical history significant for dementia, chronic dysarthria, COPD, and depression, now presenting to the emergency department for evaluation of diarrhea with loss of appetite and malaise. Dx colitis, acute hypoxic respiratory failure with concerns for aspiration, hyponatremia, hypokalemia.  He recently lost his wife, who passed away a month ago.    Assessment / Plan / Recommendation Clinical Impression  Pt presents with compromised swallow safety due primarily to mentation- presents with inconsistent cough response after consuming large, successive boluses of thin liquid from a straw.  Requires cues to seal lips and attend to cup/spoon.  Pt eager to eat ice chips, and consumed teaspoons of thin liquids with no overt s/s of aspiration; purees tolerated without deficit.  For today, given fluctuating lethargy and transfer to stepdown unit, recommend allowing meds whole in puree; teaspoon-sized sips of water and ice chips.  SLP will return next date to assess readiness for PO diet and/or value of instrumental swallow testing.  D/W Rn.  Will follow.  SLP Visit Diagnosis: Dysphagia, unspecified (R13.10)    Aspiration Risk  Mild aspiration risk    Diet Recommendation   allow tspns water/ice chips; meds whole in puree  Medication Administration: Whole meds with puree    Other   Recommendations Oral Care Recommendations: Oral care prior to ice chip/H20;Oral care QID   Follow up Recommendations        Frequency and Duration min 2x/week  1 week       Prognosis Prognosis for Safe Diet Advancement: Good      Swallow Study   General Date of Onset: 05/09/17 HPI: 82 y.o.malewith medical history significant for dementia, chronic dysarthria, COPD, and depression, now presenting to the emergency department for evaluation of diarrhea with loss of appetite and malaise. Dx colitis, acute hypoxic respiratory failure with concerns for aspiration, hyponatremia, hypokalemia.  He recently lost his wife, who passed a month ago.  Type of Study: Bedside Swallow Evaluation Previous Swallow Assessment: no Diet Prior to this Study: NPO Temperature Spikes Noted: No Respiratory Status: Nasal cannula ( CXR: Emphysema without acute cardiopulmonary findings.) History of Recent Intubation: No Behavior/Cognition: Alert;Cooperative Oral Cavity Assessment: Dry Oral Care Completed by SLP: No Oral Cavity - Dentition: Edentulous Vision: Functional for self-feeding Self-Feeding Abilities: Needs assist Patient Positioning: Upright in bed Baseline Vocal Quality: Normal Volitional Cough: Strong Volitional Swallow: Able to elicit    Oral/Motor/Sensory Function Overall Oral Motor/Sensory Function: Within functional limits   Ice Chips Ice chips: Within functional limits Presentation: Spoon   Thin Liquid Thin Liquid: Impaired Presentation: Straw Pharyngeal  Phase Impairments: Cough - Immediate    Nectar Thick Nectar Thick Liquid: Not tested   Honey Thick Honey Thick Liquid: Not tested   Puree Puree: Within functional limits   Solid   GO   Solid: Not tested        Blenda Mounts Laurice 05/10/2017,4:37  PM  Sahara Fujimoto L. Samson Fredericouture, [place] CCC/SLP Pager [phone]

## 2017-05-10 NOTE — Progress Notes (Signed)
CRITICAL VALUE ALERT  Critical Value:  Lactic Acid 2.6  Date & Time Notied:  05/10/17 @ 1720  Provider Notified  Orders Received/Actions taken: no orders given at this time

## 2017-05-10 NOTE — Progress Notes (Signed)
CRITICAL VALUE ALERT  Critical Value:  Lactic Acid 3.3  Date & Time Notied:  05/10/17 @ 2109  Provider Notified: Toniann FailKakrakandy   Orders Received/Actions taken: Will give 500 ml NS bolus and increase IVF to D5/NS @ 125. Recheck Lactic Acid at 0000.

## 2017-05-11 ENCOUNTER — Inpatient Hospital Stay (HOSPITAL_COMMUNITY)
Admit: 2017-05-11 | Discharge: 2017-05-11 | Disposition: A | Discharge status: Home / Self Care | Payer: Medicare Other | Attending: Internal Medicine | Admitting: Internal Medicine

## 2017-05-11 DIAGNOSIS — R0902 Hypoxemia: Secondary | ICD-10-CM

## 2017-05-11 DIAGNOSIS — R4182 Altered mental status, unspecified: Secondary | ICD-10-CM

## 2017-05-11 LAB — TROPONIN I

## 2017-05-11 LAB — GLUCOSE, CAPILLARY: Glucose-Capillary: 155 mg/dL — ABNORMAL HIGH (ref 65–99)

## 2017-05-11 LAB — CBC
HCT: 31 % — ABNORMAL LOW (ref 39.0–52.0)
HEMOGLOBIN: 10.7 g/dL — AB (ref 13.0–17.0)
MCH: 31 pg (ref 26.0–34.0)
MCHC: 34.5 g/dL (ref 30.0–36.0)
MCV: 89.9 fL (ref 78.0–100.0)
Platelets: 174 10*3/uL (ref 150–400)
RBC: 3.45 MIL/uL — AB (ref 4.22–5.81)
RDW: 14.1 % (ref 11.5–15.5)
WBC: 19.9 10*3/uL — ABNORMAL HIGH (ref 4.0–10.5)

## 2017-05-11 LAB — HEPATIC FUNCTION PANEL
ALT: 13 U/L — ABNORMAL LOW (ref 17–63)
AST: 27 U/L (ref 15–41)
Albumin: 2.6 g/dL — ABNORMAL LOW (ref 3.5–5.0)
Alkaline Phosphatase: 35 U/L — ABNORMAL LOW (ref 38–126)
Total Bilirubin: 0.2 mg/dL — ABNORMAL LOW (ref 0.3–1.2)
Total Protein: 4.6 g/dL — ABNORMAL LOW (ref 6.5–8.1)

## 2017-05-11 LAB — URINE CULTURE
CULTURE: NO GROWTH
SPECIAL REQUESTS: NORMAL

## 2017-05-11 LAB — BASIC METABOLIC PANEL
ANION GAP: 4 — AB (ref 5–15)
BUN: 9 mg/dL (ref 6–20)
CHLORIDE: 113 mmol/L — AB (ref 101–111)
CO2: 18 mmol/L — ABNORMAL LOW (ref 22–32)
CREATININE: 0.77 mg/dL (ref 0.61–1.24)
Calcium: 6.9 mg/dL — ABNORMAL LOW (ref 8.9–10.3)
GFR calc Af Amer: >60 mL/min (ref >60)
GFR calc non Af Amer: >60 mL/min (ref >60)
Glucose, Bld: 148 mg/dL — ABNORMAL HIGH (ref 65–99)
Potassium: 3.4 mmol/L — ABNORMAL LOW (ref 3.5–5.1)
SODIUM: 135 mmol/L (ref 135–145)

## 2017-05-11 LAB — LACTIC ACID, PLASMA
LACTIC ACID, VENOUS: 2.6 mmol/L — AB (ref 0.5–1.9)
Lactic Acid, Venous: 1.1 mmol/L (ref 0.5–1.9)

## 2017-05-11 LAB — MAGNESIUM: MAGNESIUM: 1.6 mg/dL — AB (ref 1.7–2.4)

## 2017-05-11 MED ORDER — GABAPENTIN 300 MG PO CAPS
300.0000 mg | ORAL_CAPSULE | Freq: Every day | ORAL | Status: DC
Start: 2017-05-11 — End: 2017-05-16
  Administered 2017-05-11 – 2017-05-15 (×5): 300 mg via ORAL
  Filled 2017-05-11 (×5): qty 1

## 2017-05-11 MED ORDER — IPRATROPIUM-ALBUTEROL 0.5-2.5 (3) MG/3ML IN SOLN: 3.0000 mL | Freq: Four times a day (QID) | RESPIRATORY_TRACT | Status: DC | PRN

## 2017-05-11 MED ORDER — MAGNESIUM SULFATE 2 GM/50ML IV SOLN
2.0000 g | Freq: Once | INTRAVENOUS | Status: AC
  Administered 2017-05-11: 2 g via INTRAVENOUS
  Filled 2017-05-11: qty 50

## 2017-05-11 MED ORDER — IPRATROPIUM-ALBUTEROL 0.5-2.5 (3) MG/3ML IN SOLN: 3.0000 mL | Freq: Two times a day (BID) | RESPIRATORY_TRACT | Status: DC

## 2017-05-11 MED ORDER — POTASSIUM CHLORIDE 10 MEQ/100ML IV SOLN
10.0000 meq | INTRAVENOUS | Status: AC
  Administered 2017-05-11 (×2): 10 meq via INTRAVENOUS
  Filled 2017-05-11 (×2): qty 100

## 2017-05-11 MED ORDER — SODIUM CHLORIDE 0.9 % IV BOLUS (SEPSIS)
500.0000 mL | Freq: Once | INTRAVENOUS | Status: AC
  Administered 2017-05-11: 500 mL via INTRAVENOUS

## 2017-05-11 MED ORDER — CALCIUM CARBONATE 1250 (500 CA) MG PO TABS
1.0000 | ORAL_TABLET | Freq: Two times a day (BID) | ORAL | Status: DC
  Administered 2017-05-11 – 2017-05-16 (×11): 500 mg via ORAL
  Filled 2017-05-11 (×11): qty 1

## 2017-05-11 NOTE — Care Management Note (Signed)
Case Management Note  Patient Details  Name: David [name] MRN: [id number] Date of Birth: 1935/06/09  Subjective/Objective:     colitis               Action/Plan: Date:  May 11, 2017 Chart reviewed for concurrent status and case management needs. Will continue to follow patient progress. Discharge Planning: following for needs/lives alone with help of adult children. Expected discharge date: [id number] Marcelle SmilingRhonda Quintin Hjort, BSN, [place]RN3, [place]CCM   [phone] Expected Discharge Date:                  Expected Discharge Plan:  Home/Self Care  In-House Referral:     Discharge planning Services  CM Consult  Post Acute Care Choice:    Choice offered to:     DME Arranged:    DME Agency:     HH Arranged:    HH Agency:     Status of Service:  In process, will continue to follow  If discussed at Long Length of Stay Meetings, dates discussed:    Additional Comments:  Golda AcreDavis, Ani Deoliveira Lynn, RN 05/11/2017, 8:23 AM

## 2017-05-11 NOTE — Progress Notes (Signed)
CRITICAL VALUE ALERT  Critical Value:  Lactic Acid 2.6  Date & Time Notied:  05/11/17 0130  Provider Notified: Toniann FailKakrakandy  Orders Received/Actions taken: No new orders at this time

## 2017-05-11 NOTE — Procedures (Signed)
ELECTROENCEPHALOGRAM REPORT  Date of Study: 05/11/2017  Patient's Name: Armida SansRobert E Raucci MRN: [id number] Date of Birth: 1935-02-17  Referring Provider: Dr. Hartley BarefootBelkys Regalado  Clinical History: This is an 82 year old man with altered mental status, lethargy  Medications: gabapentin (NEURONTIN) capsule 300 mg  acetaminophen (TYLENOL) tablet 650 mg  calcium carbonate (OS-CAL - dosed in mg of elemental calcium) tablet 500 mg of elemental calcium  citalopram (CELEXA) 10 MG/5ML suspension 20 mg  dextrose 5 %-0.9 % sodium chloride infusion  finasteride (PROSCAR) tablet 5 mg  ipratropium-albuterol (DUONEB) 0.5-2.5 (3) MG/3ML nebulizer solution 3 mL  loratadine (CLARITIN) tablet 10 mg  metroNIDAZOLE (FLAGYL) IVPB 500 mg  multivitamin with minerals tablet 1 tablet  potassium chloride 10 mEq in 100 mL IVPB  sodium chloride flush (NS) 0.9 % injection 3 mL  tamsulosin (FLOMAX) capsule 0.4 mg  vancomycin (VANCOCIN) 50    Technical Summary: A multichannel digital EEG recording measured by the international 10-20 system with electrodes applied with paste and impedances below 5000 ohms performed as portable with EKG monitoring in an awake and drowsy patient.  Hyperventilation and photic stimulation were not performed.  The digital EEG was referentially recorded, reformatted, and digitally filtered in a variety of bipolar and referential montages for optimal display.   Description: The patient is awake and drowsy during the recording. There is no clear posterior dominant rhythm. The background consists of a large amount of diffuse 4-5 Hz theta and 2-3 Hz delta slowing of the background. Normal sleep architecture was not seen. Hyperventilation and photic stimulation were not performed. There was muscle artifact over the bilateral frontotemporal regions, in between artifact, there were no epileptiform discharges or electrographic seizures seen.    Technical difficulties with EKG  lead.  Impression: This awake and drowsy EEG is abnormal due to moderate diffuse slowing of the background.  Clinical Correlation of the above findings indicates diffuse cerebral dysfunction that is non-specific in etiology and can be seen with hypoxic/ischemic injury, toxic/metabolic encephalopathies, neurodegenerative disorders, or medication effect.  The absence of epileptiform discharges does not rule out a clinical diagnosis of epilepsy.  Clinical correlation is advised.   Patrcia DollyKaren Aquino, M.D.

## 2017-05-11 NOTE — Progress Notes (Addendum)
PROGRESS NOTE    David [name]  WUJ:[id number] DOB: 19-Jun-1935 DOA: 05/09/2017 PCP: Patient, No Pcp Per    Brief Narrative: David [name] is a 82 y.o. male with medical history significant for dementia, chronic dysarthria, COPD, and depression, now presenting to the emergency department for evaluation of diarrhea with loss of appetite and malaise. Patient is accompanied by his granddaughter who assists with the history. He has reportedly been suffering from intermittent loose stools for over a year now, but had been doing fairly well recently until this morning when he had 2 episodes of diarrhea associated with generalized weakness, loss of appetite, and a nonspecific malaise. Reports transient chills, but no fever. He had a similar episode 10 days ago, was evaluated in the emergency department at that time, he was suspected of having a UTI, and is now status post treatment with Keflex.    Assessment & Plan:   Principal Problem:   Colitis Active Problems:   Depression   Hyponatremia   Normocytic anemia   Dementia   Pancreatic lesion   Colitis presumed infectious   Occult blood positive stool   Diarrhea of presumed infectious origin   C diff Colitis, C diff positive - Pt presents with diarrhea, malaise, brief episode of chills, loss of appetite  - Has been on Keflex recently for suspected UTI   - CT findings suggest infectious colitis; no fever or leukocytosis, normal lactate  - Brown stool, positive for occult blood  - Blood culture , urine culture no growth, C diff assay positive, and GI pathogen panel; negative -C diff positive. Started oral vancomycin. Discontinue cipro.   Early sepsis;  Lactic acid increase to 3.  Continue with IV fluids.  treatment for C diff.  Trend lactic acid.   Metabolic acidosis; IV fluids.  Hypomagnesemia; replete IV.  Corrected calcium at 8.    Acute hypoxic respiratory failure;  Concern with aspiration, will make him NP except for  meds.  Swallow evaluation. Started on dysphagia 3 diet  Chest x ray ; emphysema.  Nebulizer treatment change to PRN. Marland Kitchen   Delirium;  Called by family that patient was notice to be confused, more lethargic. Not at baseline, he is more lethargic.  -ABG. Was negative for hypercapnia.  -Start CT head negative for stroke. EEG ; is abnormal due to moderate diffuse slowing of the background. - EKG, CBC, b-met, troponin negative .  -he also received gabapentin in am. That could make him lethargic -ammonia norma, B-12.  -neurology consulted. Appreciate Neuro evaluation. They think patient has delirium  -resume gabapentin and bedtime  Leukocytosis; related to C diff.   Hyponatremia  - improved with IV fluids.   Hypokalemia; replete IV  Depression - Worse since death of his wife last month. He has good family support.  - Continue Celexa    Anemia, occult blood in stool ; hb stable.  - Hgb is 11.0 on admission, up from 10.0 earlier in the month - No gross blood in stool, but FOBT+  - Likely secondary to acute colitis, eval and tx as above  - VTE ppx with SCD's and repeat CBC in am    . Incidental pancreatic lesion  - Noted on CT abd/pelvis  - Follow-up imaging in 2 years recommended as appropriate      DVT prophylaxis: SCD, occult blood positive Code Status: full code.  Family Communication: no family at bedside.  Disposition Plan: to be determine.    Consultants:   none  Procedures:   none   Antimicrobials:   Vancomycin oral and flagyl.    Subjective: More alert, answer questions. Mild abdominal pain   Objective: Vitals:   05/11/17 1243 05/11/17 1400 05/11/17 1545 05/11/17 1548  BP:  122/78  123/72  Pulse:      Resp: 19 16  (!) 23  Temp:   98.2 F (36.8 C)   TempSrc:   Axillary   SpO2: 100% 99%  94%  Weight:      Height:        Intake/Output Summary (Last 24 hours) at 05/11/17 1606 Last data filed at 05/11/17 1500  Gross per 24 hour  Intake           3114.17 ml  Output              310 ml  Net          2804.17 ml   Filed Weights   05/10/17 0622 05/10/17 1527 05/11/17 0428  Weight: 62.1 kg (136 lb 14.5 oz) 61.2 kg (134 lb 14.7 oz) 63.9 kg (140 lb 14 oz)    Examination:  General exam: NAD Respiratory system: respiratory effort normal decreased breath sound.  Cardiovascular system: s 1, S 2 RRR Gastrointestinal system:Abdomen is non distended, mild tender, nr Central nervous system: alert in no distress, non focal.  Extremities: Symmetric 5 x 5 power.      Data Reviewed: I have personally reviewed following labs and imaging studies  CBC:  Recent Labs Lab 05/09/17 1320 05/10/17 0531 05/10/17 1423 05/11/17 0332  WBC 10.1 18.8* 21.4* 19.9*  NEUTROABS 8.5* 15.4*  --   --   HGB 11.0* 11.7* 12.0* 10.7*  HCT 32.1* 34.3* 35.2* 31.0*  MCV 90.7 89.1 89.6 89.9  PLT 170 198 190 092   Basic Metabolic Panel:  Recent Labs Lab 05/09/17 1320 05/10/17 0531 05/10/17 1423 05/11/17 0332  NA 134* 135 136 135  K 3.7 3.3* 4.0 3.4*  CL 102 105 108 113*  CO2 24 22 21* 18*  GLUCOSE 123* 136* 143* 148*  BUN 16 12 12 9   CREATININE 1.04 0.89 0.96 0.77  CALCIUM 8.7* 8.0* 7.8* 6.9*  MG  --   --   --  1.6*   GFR: Estimated Creatinine Clearance: 66.5 mL/min (by C-G formula based on SCr of 0.77 mg/dL). Liver Function Tests:  Recent Labs Lab 05/09/17 1320 05/11/17 0057  AST 17 27  ALT 12* 13*  ALKPHOS 59 35*  BILITOT 0.5 0.2*  PROT 6.3* 4.6*  ALBUMIN 3.7 2.6*    Recent Labs Lab 05/09/17 1320  LIPASE 27    Recent Labs Lab 05/10/17 1605  AMMONIA 15   Coagulation Profile: No results for input(s): INR, PROTIME in the last 168 hours. Cardiac Enzymes:  Recent Labs Lab 05/09/17 1321 05/10/17 1423 05/10/17 1927 05/11/17 0057  TROPONINI <0.03 <0.03 <0.03 <0.03   BNP (last 3 results) No results for input(s): PROBNP in the last 8760 hours. HbA1C: No results for input(s): HGBA1C in the last 72  hours. CBG:  Recent Labs Lab 05/10/17 0745 05/10/17 1340 05/11/17 0740  GLUCAP 126* 144* 155*   Lipid Profile: No results for input(s): CHOL, HDL, LDLCALC, TRIG, CHOLHDL, LDLDIRECT in the last 72 hours. Thyroid Function Tests:  Recent Labs  05/10/17 1927  TSH 3.383   Anemia Panel:  Recent Labs  05/10/17 1605  VITAMINB12 578   Sepsis Labs:  Recent Labs Lab 05/10/17 1605 05/10/17 1927 05/11/17 0057 05/11/17 1141  LATICACIDVEN 2.6* 3.3*  2.6* 1.1    Recent Results (from the past 240 hour(s))  Urine culture     Status: None   Collection Time: 05/09/17  1:22 PM  Result Value Ref Range Status   Specimen Description URINE, CATHETERIZED  Final   Special Requests Normal  Final   Culture   Final    NO GROWTH Performed at [facility], [address], [place], [place] [address]    Report Status 05/11/2017 FINAL  Final  Blood culture (routine x 2)     Status: None (Preliminary result)   Collection Time: 05/09/17  1:40 PM  Result Value Ref Range Status   Specimen Description BLOOD RIGHT ARM  Final   Special Requests   Final    BOTTLES DRAWN AEROBIC AND ANAEROBIC Blood Culture adequate volume   Culture   Final    NO GROWTH 2 DAYS Performed at [facility], [place] [address], [place], [place] [address]    Report Status PENDING  Incomplete  C difficile quick scan w PCR reflex     Status: Abnormal   Collection Time: 05/09/17  3:00 PM  Result Value Ref Range Status   C Diff antigen POSITIVE (A) NEGATIVE Final   C Diff toxin POSITIVE (A) NEGATIVE Final   C Diff interpretation Toxin producing C. difficile detected.  Final    Comment: CRITICAL RESULT CALLED TO, READ BACK BY AND VERIFIED WITH: D. [id number] 07.22.2018 N. MORRIS   Gastrointestinal Panel by PCR , Stool     Status: None   Collection Time: 05/09/17  4:34 PM  Result Value Ref Range Status   Campylobacter species NOT DETECTED NOT DETECTED Final   Plesimonas shigelloides NOT DETECTED NOT  DETECTED Final   Salmonella species NOT DETECTED NOT DETECTED Final   Yersinia enterocolitica NOT DETECTED NOT DETECTED Final   Vibrio species NOT DETECTED NOT DETECTED Final   Vibrio cholerae NOT DETECTED NOT DETECTED Final   Enteroaggregative E coli (EAEC) NOT DETECTED NOT DETECTED Final   Enteropathogenic E coli (EPEC) NOT DETECTED NOT DETECTED Final   Enterotoxigenic E coli (ETEC) NOT DETECTED NOT DETECTED Final   Shiga like toxin producing E coli (STEC) NOT DETECTED NOT DETECTED Final   Shigella/Enteroinvasive E coli (EIEC) NOT DETECTED NOT DETECTED Final   Cryptosporidium NOT DETECTED NOT DETECTED Final   Cyclospora cayetanensis NOT DETECTED NOT DETECTED Final   Entamoeba histolytica NOT DETECTED NOT DETECTED Final   Giardia lamblia NOT DETECTED NOT DETECTED Final   Adenovirus F40/41 NOT DETECTED NOT DETECTED Final   Astrovirus NOT DETECTED NOT DETECTED Final   Norovirus GI/GII NOT DETECTED NOT DETECTED Final   Rotavirus A NOT DETECTED NOT DETECTED Final   Sapovirus (I, II, IV, and V) NOT DETECTED NOT DETECTED Final  MRSA PCR Screening     Status: None   Collection Time: 05/10/17  3:29 PM  Result Value Ref Range Status   MRSA by PCR NEGATIVE NEGATIVE Final    Comment:        The GeneXpert MRSA Assay (FDA approved for NASAL specimens only), is one component of a comprehensive MRSA colonization surveillance program. It is not intended to diagnose MRSA infection nor to guide or monitor treatment for MRSA infections.          Radiology Studies: Ct Head Wo Contrast  Result Date: 05/10/2017 CLINICAL DATA:  Advanced and dementia.  Colitis. EXAM: CT HEAD WITHOUT CONTRAST TECHNIQUE: Contiguous axial images were obtained from the base of the skull through  the vertex without intravenous contrast. COMPARISON:  CT 04/29/2017 FINDINGS: Brain: Advanced generalized brain atrophy. Advanced chronic small-vessel ischemic changes of the cerebral hemispheric white matter. Old small vessel  infarctions of the basal ganglia and thalami. No sign of acute infarction, mass lesion, hemorrhage, hydrocephalus or extra-axial collection. Vascular: There is atherosclerotic calcification of the major vessels at the base of the brain. Skull: Normal Sinuses/Orbits: Clear/normal Other: None significant IMPRESSION: No acute or reversible finding by CT. Advanced generalized atrophy with chronic small-vessel ischemic changes throughout the brain. Electronically Signed   By: Nelson Chimes M.D.   On: 05/10/2017 15:04   Dg Chest Port 1 View  Result Date: 05/10/2017 CLINICAL DATA:  Increased dyspnea and weakness this morning EXAM: PORTABLE CHEST 1 VIEW COMPARISON:  Portable exam 1113 hours compared to 05/09/2017 FINDINGS: Normal heart size, mediastinal contours, and pulmonary vascularity. Atherosclerotic calcifications aorta. Emphysematous changes without infiltrate, pleural effusion or pneumothorax. Calcified adenopathy at LEFT hilum. Bones demineralized. IMPRESSION: Emphysematous and old granulomatous disease changes. No acute abnormalities. Aortic Atherosclerosis (ICD10-I70.0) and Emphysema (ICD10-J43.9). Electronically Signed   By: Lavonia Dana M.D.   On: 05/10/2017 11:26        Scheduled Meds: . calcium carbonate  1 tablet Oral BID WC  . citalopram  20 mg Oral Daily  . finasteride  5 mg Oral Daily  . gabapentin  300 mg Oral QHS  . loratadine  10 mg Oral Daily  . multivitamin with minerals  1 tablet Oral Daily  . sodium chloride flush  3 mL Intravenous Q12H  . tamsulosin  0.4 mg Oral QPC breakfast  . vancomycin  500 mg Oral Q6H   Continuous Infusions: . dextrose 5 % and 0.9% NaCl 125 mL/hr at 05/11/17 1556  . metronidazole Stopped (05/11/17 1039)     LOS: 2 days    Time spent: 35 minutes.     Elmarie Shiley, MD [facility] Pager [phone]  If 7PM-7AM, please contact night-coverage [email or web address] Password [facility] 05/11/2017, 4:06 PM

## 2017-05-11 NOTE — Progress Notes (Signed)
Offsite EEG completed at [facility]. Results pending. 

## 2017-05-11 NOTE — Progress Notes (Signed)
  Speech Language Pathology Treatment: Dysphagia  Patient Details Name: David SansRobert E Bobo MRN: [id number] DOB: 02/06/1935 Today's Date: 05/11/2017 Time: 1330-1405 SLP Time Calculation (min) (ACUTE ONLY): 35 min  Assessment / Plan / Recommendation Clinical Impression  Pt with improved mentation today and able to follow directions and hold conversation.  Family present *granddaughter Dorathy DaftKayla* reports that pt "aspirates" at home prior to this admission.  Coughing occurs at home with food and drink per pt and Kayla.  However upon further questioning, coughing occurs more often with mixed consistencies.  SLP advised re: compensating for dysphagia and aspiration mitigation strategies reviewed with mod I.    Observed pt self feeding graham crackers, applesauce and apple juice - no clinical indications of aspiration nor severe signs/symptoms of dysphagia.   Pt does tend to take large liquid boluses and advised small boluses may be more protective. Pt "masticates" well despite edentulous status and no oral residuals.  He does tend to place boluses toward the left side of his mouth with a minimal amount of labial stasis.  Rec dys3/thin. Will follow up x1 given h/o dysphagia.      HPI HPI: 82 y.o.malewith medical history significant for dementia, chronic dysarthria, COPD, and depression, now presenting to the emergency department for evaluation of diarrhea with loss of appetite and malaise. Dx colitis, acute hypoxic respiratory failure with concerns for aspiration, hyponatremia, hypokalemia.  He recently lost his wife, who passed a month ago.       SLP Plan  Continue with current plan of care       Recommendations  Diet recommendations: Dysphagia 3 (mechanical soft);Thin liquid;Other(comment) (pureed meats, extra gravy/sauce) Liquids provided via: Cup;Straw Medication Administration: Whole meds with puree Supervision: Full supervision/cueing for compensatory strategies Compensations: Slow rate;Small  sips/bites Postural Changes and/or Swallow Maneuvers: Seated upright 90 degrees;Upright 30-60 min after meal                Follow up Recommendations:  (tbd) SLP Visit Diagnosis: Dysphagia, oropharyngeal phase (R13.12) Plan: Continue with current plan of care       GO              Donavan Burnetamara Donoven Pett, MS [facility] SLP [phone]   [name], Jad Johansson Ann 05/11/2017, 2:22 PM

## 2017-05-11 NOTE — Progress Notes (Signed)
SLP Cancellation Note  Patient Details Name: David SansRobert E [name] MRN: [id number] DOB: May 24, 1935   Cancelled treatment:       Reason Eval/Treat Not Completed: Medical issues which prohibited therapy (pt having EEG)   [name], [name] 05/11/2017, 10:39 AM  [name], MS [facility] SLP [phone]

## 2017-05-12 DIAGNOSIS — E43 Unspecified severe protein-calorie malnutrition: Secondary | ICD-10-CM | POA: Insufficient documentation

## 2017-05-12 LAB — GLUCOSE, CAPILLARY: Glucose-Capillary: 127 mg/dL — ABNORMAL HIGH (ref 65–99)

## 2017-05-12 LAB — BASIC METABOLIC PANEL
ANION GAP: 6 (ref 5–15)
Anion gap: 3 — ABNORMAL LOW (ref 5–15)
BUN: <5 mg/dL — ABNORMAL LOW (ref 6–20)
CALCIUM: 7.5 mg/dL — AB (ref 8.9–10.3)
CALCIUM: 7.8 mg/dL — AB (ref 8.9–10.3)
CHLORIDE: 115 mmol/L — AB (ref 101–111)
CHLORIDE: 113 mmol/L — AB (ref 101–111)
CO2: 21 mmol/L — AB (ref 22–32)
CO2: 21 mmol/L — AB (ref 22–32)
CREATININE: 0.64 mg/dL (ref 0.61–1.24)
CREATININE: 0.69 mg/dL (ref 0.61–1.24)
GFR calc non Af Amer: >60 mL/min (ref >60)
GFR calc non Af Amer: >60 mL/min (ref >60)
GLUCOSE: 135 mg/dL — AB (ref 65–99)
Glucose, Bld: 144 mg/dL — ABNORMAL HIGH (ref 65–99)
Potassium: 3.5 mmol/L (ref 3.5–5.1)
Potassium: 3.6 mmol/L (ref 3.5–5.1)
SODIUM: 140 mmol/L (ref 135–145)
Sodium: 139 mmol/L (ref 135–145)

## 2017-05-12 LAB — CBC
HEMATOCRIT: 31.2 % — AB (ref 39.0–52.0)
HEMOGLOBIN: 10.9 g/dL — AB (ref 13.0–17.0)
MCH: 31.1 pg (ref 26.0–34.0)
MCHC: 34.9 g/dL (ref 30.0–36.0)
MCV: 88.9 fL (ref 78.0–100.0)
Platelets: 169 10*3/uL (ref 150–400)
RBC: 3.51 MIL/uL — ABNORMAL LOW (ref 4.22–5.81)
RDW: 14.1 % (ref 11.5–15.5)
WBC: 18.2 10*3/uL — ABNORMAL HIGH (ref 4.0–10.5)

## 2017-05-12 LAB — MAGNESIUM: Magnesium: 2 mg/dL (ref 1.7–2.4)

## 2017-05-12 LAB — ALBUMIN: ALBUMIN: 2.8 g/dL — AB (ref 3.5–5.0)

## 2017-05-12 MED ORDER — IPRATROPIUM-ALBUTEROL 0.5-2.5 (3) MG/3ML IN SOLN
3.0000 mL | Freq: Two times a day (BID) | RESPIRATORY_TRACT | Status: DC
  Administered 2017-05-12 – 2017-05-16 (×8): 3 mL via RESPIRATORY_TRACT
  Filled 2017-05-12 (×8): qty 3

## 2017-05-12 MED ORDER — POTASSIUM CHLORIDE CRYS ER 20 MEQ PO TBCR
40.0000 meq | EXTENDED_RELEASE_TABLET | Freq: Once | ORAL | Status: AC
  Administered 2017-05-12: 40 meq via ORAL
  Filled 2017-05-12: qty 2

## 2017-05-12 MED ORDER — MELATONIN 3 MG PO TABS: 6.0000 mg | ORAL_TABLET | Freq: Every day | ORAL | Status: DC

## 2017-05-12 MED ORDER — ENSURE ENLIVE PO LIQD
237.0000 mL | Freq: Two times a day (BID) | ORAL | Status: DC
  Administered 2017-05-12 – 2017-05-16 (×8): 237 mL via ORAL

## 2017-05-12 MED ORDER — BUDESONIDE 0.25 MG/2ML IN SUSP
0.2500 mg | Freq: Two times a day (BID) | RESPIRATORY_TRACT | Status: DC
  Administered 2017-05-12 – 2017-05-16 (×8): 0.25 mg via RESPIRATORY_TRACT
  Filled 2017-05-12 (×8): qty 2

## 2017-05-12 NOTE — Clinical Social Work Placement (Signed)
   CLINICAL SOCIAL WORK PLACEMENT  NOTE  Date:  05/12/2017  Patient Details  Name: David [name] MRN: [id number] Date of Birth: 11/02/1934  Clinical Social Work is seeking post-discharge placement for this patient at the Skilled  Nursing Facility level of care (*CSW will initial, date and re-position this form in  chart as items are completed):      Patient/family provided with [facility] Clinical Social Work Department's list of facilities offering this level of care within the geographic area requested by the patient (or if unable, by the patient's family).  Yes   Patient/family informed of their freedom to choose among providers that offer the needed level of care, that participate in Medicare, Medicaid or managed care program needed by the patient, have an available bed and are willing to accept the patient.  Yes   Patient/family informed of [facility]'s ownership interest in [facility] and [facility], as well as of the fact that they are under no obligation to receive care at these facilities.  PASRR submitted to EDS on 05/12/17     PASRR number received on       Existing PASRR number confirmed on       FL2 transmitted to all facilities in geographic area requested by pt/family on 05/12/17     FL2 transmitted to all facilities within larger geographic area on       Patient informed that his/her managed care company has contracts with or will negotiate with certain facilities, including the following:            Patient/family informed of bed offers received.  Patient chooses bed at       Physician recommends and patient chooses bed at      Patient to be transferred to   on  .  Patient to be transferred to facility by       Patient family notified on   of transfer.  Name of family member notified:        PHYSICIAN       Additional Comment:    _______________________________________________ David [name], David Ditmer Lee, LCSW  [phone] 05/12/2017, 3:04  PM

## 2017-05-12 NOTE — Clinical Social Work Note (Addendum)
Clinical Social Work Assessment  Patient Details  Name: David SansRobert E Boehme MRN: [id number] Date of Birth: 22-Sep-1935  Date of referral:  05/12/17               Reason for consult:  Discharge Planning                Permission sought to share information with:    Permission granted to share information::     Name::        Agency::     Relationship::     Contact Information:     Housing/Transportation Living arrangements for the past 2 months:  Single Family Home Source of Information:  Adult Children Patient Interpreter Needed:  None Criminal Activity/Legal Involvement Pertinent to Current Situation/Hospitalization:  No - Comment as needed Significant Relationships:  Adult Children, Other Family Members Lives with:  Relatives Do you feel safe going back to the place where you live?  Yes Need for family participation in patient care:  Yes (Comment)  Care giving concerns:  No concerns reported by family at this time.   Social Worker assessment / plan:  Pt hospitalized on 05/09/17 from home with granddaughter with colitis. PT has recommended SNF at dc. Pt is alert / oriented x1 and unable to participate in dc planning. CSW spoke with pt's daughter / granddaughter to review PT recommendations. Family will give this option some thought. CSW has permission to initiate SNF search and will provide bed offers once available. Family would like to be present during next PT visit. CSW will assist with coordination and continue to follow to assist with d/c planning needs.  Employment status:  Retired Health and safety inspectornsurance information:  Medicare PT Recommendations:  Skilled Nursing Facility Information / Referral to community resources:  Skilled Nursing Facility  Patient/Family's Response to care: Disposition to be determined.  Patient/Family's Understanding of and Emotional Response to Diagnosis, Current Treatment, and Prognosis:  Still assessing.  Emotional Assessment Appearance:  Appears stated  age Attitude/Demeanor/Rapport:  Unable to Assess Affect (typically observed):  Unable to Assess Orientation:  Oriented to Self Alcohol / Substance use:  Not Applicable Psych involvement (Current and /or in the community):  No (Comment)  Discharge Needs  Concerns to be addressed:  Discharge Planning Concerns Readmission within the last 30 days:  No Current discharge risk:  None Barriers to Discharge:  No Barriers Identified   Royetta AsalHaidinger, Edita Weyenberg Lee, LCSW  [phone] 05/12/2017, 2:15 PM

## 2017-05-12 NOTE — Evaluation (Signed)
Physical Therapy Evaluation Patient Details Name: David [name] MRN: [id number] DOB: Sep 29, 1935 Today's Date: 05/12/2017   History of Present Illness  David [name] is a 82 y.o. male with medical history significant for dementia, chronic dysarthria, COPD, and depression, now presenting to the emergency department for evaluation of diarrhea with loss of appetite and malaise. positive for C Diff., recent treatment for suspected UTI.  Clinical Impression  The [patient  Became more restless and agitated with attempts to stand and pivot. Assisted back into bed due to agitation. No family present for information  In regards to prior function and caregiver status. Pt admitted with above diagnosis. Pt currently with functional limitations due to the deficits listed below (see PT Problem List). Pt will benefit from skilled PT to increase their independence and safety with mobility to allow discharge to the venue listed below.       Follow Up Recommendations SNF;Supervision/Assistance - 24 hour    Equipment Recommendations  None recommended by PT    Recommendations for Other Services       Precautions / Restrictions Precautions Precautions: Fall Precaution Comments: somewhat combative Restrictions Weight Bearing Restrictions: No      Mobility  Bed Mobility Overal bed mobility: Needs Assistance Bed Mobility: Supine to Sit;Sit to Supine     Supine to sit: Total assist;+2 for physical assistance;+2 for safety/equipment Sit to supine: Total assist;+2 for physical assistance;+2 for safety/equipment   General bed mobility comments: patien assuisted to sitting, use of bed pad. Assisted back into bed. patient more  agitated with attempts to stand. Swinging arms at therapist. Has mittens in place  Transfers                 General transfer comment: attempted, feet sliding out and /or picking feet off of the floor.  Ambulation/Gait                Stairs             Wheelchair Mobility    Modified Rankin (Stroke Patients Only)       Balance Overall balance assessment: Needs assistance Sitting-balance support: No upper extremity supported Sitting balance-Leahy Scale: Poor   Postural control: Posterior lean                                   Pertinent Vitals/Pain Pain Assessment: Faces Faces Pain Scale: Hurts a little bit Pain Descriptors / Indicators: Discomfort;Grimacing;Restless Pain Intervention(s): Monitored during session    Home Living Family/patient expects to be discharged to:: Private residence Living Arrangements: Children;Other relatives               Additional Comments: no family present and patient unable to provide info    Prior Function           Comments: unsure     Hand Dominance        Extremity/Trunk Assessment   Upper Extremity Assessment Upper Extremity Assessment: Defer to OT evaluation    Lower Extremity Assessment Lower Extremity Assessment: Difficult to assess due to impaired cognition    Cervical / Trunk Assessment Cervical / Trunk Assessment: Kyphotic  Communication      Cognition Arousal/Alertness: Awake/alert Behavior During Therapy: Agitated;Restless Overall Cognitive Status: No family/caregiver present to determine baseline cognitive functioning  General Comments: does not follow commands, speech is not understandable      General Comments      Exercises     Assessment/Plan    PT Assessment Patient needs continued PT services  PT Problem List Decreased activity tolerance;Decreased balance;Decreased mobility;Decreased knowledge of precautions;Decreased safety awareness;Decreased knowledge of use of DME;Decreased cognition       PT Treatment Interventions DME instruction;Gait training;Functional mobility training;Therapeutic activities;Patient/family education    PT Goals (Current goals can be found in the  Care Plan section)  Acute Rehab PT Goals PT Goal Formulation: Patient unable to participate in goal setting    Frequency Min 3X/week   Barriers to discharge        Co-evaluation               AM-PAC PT "6 Clicks" Daily Activity  Outcome Measure Difficulty turning over in bed (including adjusting bedclothes, sheets and blankets)?: Total Difficulty moving from lying on back to sitting on the side of the bed? : Total Difficulty sitting down on and standing up from a chair with arms (e.g., wheelchair, bedside commode, etc,.)?: Total Help needed moving to and from a bed to chair (including a wheelchair)?: Total Help needed walking in hospital room?: Total Help needed climbing 3-5 steps with a railing? : Total 6 Click Score: 6    End of Session   Activity Tolerance: Treatment limited secondary to agitation Patient left: in bed;with call bell/phone within reach;with bed alarm set Nurse Communication: Mobility status PT Visit Diagnosis: Unsteadiness on feet (R26.81);Other abnormalities of gait and mobility (R26.89)    Time: 1610-9604 PT Time Calculation (min) (ACUTE ONLY): 23 min   Charges:   PT Evaluation $PT Eval Moderate Complexity: 1 Procedure PT Treatments $Therapeutic Activity: 8-22 mins   PT G CodesBlanchard [name] PT [phone]   David [name] 05/12/2017, 9:29 AM

## 2017-05-12 NOTE — Progress Notes (Signed)
Initial Nutrition Assessment  DOCUMENTATION CODES:   Severe malnutrition in context of chronic illness  INTERVENTION:  - Will order Ensure Enlive po BID, each supplement provides 350 kcal and 20 grams of protein - Will order Magic Cup TID with meals, each supplement provides 290 kcal and 9 grams of protein - Continue to encourage PO intakes of meals and supplements. - RD will continue to monitor for additional needs.  NUTRITION DIAGNOSIS:   Malnutrition (severe) related to chronic illness (dementia, COPD) as evidenced by severe depletion of muscle mass, severe depletion of body fat.  GOAL:   Patient will meet greater than or equal to 90% of their needs  MONITOR:   PO intake, Supplement acceptance, Weight trends, Labs, Skin  REASON FOR ASSESSMENT:   Consult Assessment of nutrition requirement/status  ASSESSMENT:   82 y.o. male with medical history significant for dementia, chronic dysarthria, COPD, and depression, now presenting to the emergency department for evaluation of diarrhea with loss of appetite and malaise. Patient is accompanied by his granddaughter who assists with the history. He has reportedly been suffering from intermittent loose stools for over a year now, but had been doing fairly well recently until this morning when he had 2 episodes of diarrhea associated with generalized weakness, loss of appetite, and a nonspecific malaise. Reports transient chills, but no fever. He had a similar episode 10 days ago, was evaluated in the emergency department at that time, he was suspected of having a UTI, and is now status post treatment with Keflex. Denies any significant abdominal pain, but does note some occasional crampy discomfort in lower quadrants.  Pt seen for consult. BMI indicates normal weight. No intakes documented since admission. Breakfast tray in room, untouched. RN reports that granddaughter was present earlier and had stated plan to stay all day but left a while  ago. Will attempt to talk with granddaughter later, if possible. Pt denies abdominal pain at this time but is not an appropriate source for information/history.   RN reports that granddaughter brought his dentures. Attempts were made to provide pt with breakfast this AM but he refused; RN plans to try again now that his dentures are present. Pt was taking applesauce with pills without issue at time of RD visit. SLP following pt and recommended current diet order. RN reports that it sounded as though pt was able to self-feed at home; he currently has bilateral mittens.   Physical assessment shows severe muscle and severe fat wasting to upper and lower body. Will need to ask granddaughter about weight trends PTA.  Medications reviewed; 1 tablet Oscal/day, daily multivitamin with minerals, 40 mEq oral KCl x1 dose today. Labs reviewed; CBGs: 155 and 127 mg/dL, Cl: 161115 mmol/L, BUN: <5 mg/dL, Ca: 7.5 mg/dL.  IVF: D5-NS @ 100 mL/hr.    Diet Order:  DIET DYS 3 Room service appropriate? Yes with Assist; Fluid consistency: Thin  Skin:  Wound (see comment) (R hand incision from 7/21)  Last BM:  7/24  Height:   Ht Readings from Last 1 Encounters:  05/10/17 5\' 6"  (1.676 m)    Weight:   Wt Readings from Last 1 Encounters:  05/12/17 147 lb 14.9 oz (67.1 kg)    Ideal Body Weight:  64.54 kg  BMI:  Body mass index is 23.88 kg/m.  Estimated Nutritional Needs:   Kcal:  1675-1880 (25-28 kcal/kg)  Protein:  75-85 grams  Fluid:  >/= 2 L/day  EDUCATION NEEDS:   No education needs identified at this time  Jarome Matin, MS, RD, LDN, [facility] Clinical Dietitian Pager # [phone] After hours/weekend pager # [phone]

## 2017-05-12 NOTE — Progress Notes (Signed)
OT Cancellation Note  Patient Details Name: David SansRobert E Littlejohn MRN: [id number] DOB: 03-14-1935   Cancelled Treatment:    Reason Eval/Treat Not Completed: Other (comment). Pt is not ready for OT.  Total +2 assistance with PT and agitated. Will check another day.  Telecia Larocque 05/12/2017, 9:41 AM  Marica OtterMaryellen Veera Stapleton, OTR/L [phone] 05/12/2017

## 2017-05-12 NOTE — NC FL2 (Signed)
[place] MEDICAID FL2 LEVEL OF CARE SCREENING TOOL     IDENTIFICATION  Patient Name: David [name] Birthdate: [birth year] Sex: male Admission Date (Current Location): 05/09/2017  [facility] [place]   Facility and Address:  [facility],  [address], [place] [id number]      Provider Number: [id number]  Attending Physician Name and Address:  Alba Coryegalado, Belkys A, MD  Relative Name and Phone Number:       Current Level of Care: Hospital Recommended Level of Care: Skilled Nursing Facility Prior Approval Number:    Date Approved/Denied:   PASRR Number:    Discharge Plan: SNF    Current Diagnoses: Patient Active Problem List   Diagnosis Date Noted  . Protein-calorie malnutrition, severe 05/12/2017  . Colitis 05/09/2017  . Depression 05/09/2017  . Hyponatremia 05/09/2017  . Normocytic anemia 05/09/2017  . Dementia 05/09/2017  . Pancreatic lesion 05/09/2017  . Colitis presumed infectious 05/09/2017  . Occult blood positive stool 05/09/2017  . Diarrhea of presumed infectious origin     Orientation RESPIRATION BLADDER Height & Weight     Self  O2 Incontinent, External catheter Weight: 67.1 kg (147 lb 14.9 oz) Height:  5\' 6"  (167.6 cm)  BEHAVIORAL SYMPTOMS/MOOD NEUROLOGICAL BOWEL NUTRITION STATUS  Other (Comment) (Pt can be anxious at times. )   Incontinent (Rectual Tube- unsure if this will be needed at dc.) Diet (DYS 3)  AMBULATORY STATUS COMMUNICATION OF NEEDS Skin   Extensive Assist Verbally Other (Comment) (Wound on right hand.)                       Personal Care Assistance Level of Assistance  Bathing, Feeding, Dressing Bathing Assistance: Maximum assistance Feeding assistance: Limited assistance (needs set up- can feed self) Dressing Assistance: Maximum assistance     Functional Limitations Info  Sight, Hearing, Speech Sight Info: Impaired Hearing Info: Adequate Speech Info: Adequate    SPECIAL CARE FACTORS  FREQUENCY  PT (By licensed PT), OT (By licensed OT)     PT Frequency: 5x wk OT Frequency: 5x wk            Contractures Contractures Info: Not present    Additional Factors Info  Isolation Precautions, Psychotropic, Code Status Code Status Info: Full Code   Psychotropic Info: celexa   Isolation Precautions Info: Enteric precautions     Current Medications (05/12/2017):  This is the current hospital active medication list Current Facility-Administered Medications  Medication Dose Route Frequency Provider Last Rate Last Dose  . acetaminophen (TYLENOL) tablet 650 mg  650 mg Oral Q6H PRN Opyd, Lavone Neriimothy S, MD       Or  . acetaminophen (TYLENOL) suppository 650 mg  650 mg Rectal Q6H PRN Opyd, Lavone Neriimothy S, MD      . budesonide (PULMICORT) nebulizer solution 0.25 mg  0.25 mg Nebulization BID Regalado, Belkys A, MD   0.25 mg at 05/12/17 0841  . calcium carbonate (OS-CAL - dosed in mg of elemental calcium) tablet 500 mg of elemental calcium  1 tablet Oral BID WC Regalado, Belkys A, MD   500 mg of elemental calcium at 05/12/17 0812  . citalopram (CELEXA) 10 MG/5ML suspension 20 mg  20 mg Oral Daily Opyd, Lavone Neriimothy S, MD   20 mg at 05/12/17 1145  . dextrose 5 %-0.9 % sodium chloride infusion   Intravenous Continuous Regalado, Belkys A, MD 100 mL/hr at 05/12/17 0245    . feeding supplement (ENSURE ENLIVE) (ENSURE ENLIVE)  liquid 237 mL  237 mL Oral BID BM Regalado, Belkys A, MD   237 mL at 05/12/17 1400  . finasteride (PROSCAR) tablet 5 mg  5 mg Oral Daily Opyd, Lavone Neri, MD   5 mg at 05/12/17 1145  . gabapentin (NEURONTIN) capsule 300 mg  300 mg Oral QHS Regalado, Belkys A, MD   300 mg at 05/11/17 2117  . ipratropium-albuterol (DUONEB) 0.5-2.5 (3) MG/3ML nebulizer solution 3 mL  3 mL Nebulization Q6H PRN Regalado, Belkys A, MD      . ipratropium-albuterol (DUONEB) 0.5-2.5 (3) MG/3ML nebulizer solution 3 mL  3 mL Nebulization Q12H Regalado, Belkys A, MD   3 mL at 05/12/17 0841  . loratadine  (CLARITIN) tablet 10 mg  10 mg Oral Daily Opyd, Lavone Neri, MD   10 mg at 05/12/17 1145  . metroNIDAZOLE (FLAGYL) IVPB 500 mg  500 mg Intravenous Q8H Opyd, Lavone Neri, MD   Stopped at 05/12/17 0911  . multivitamin with minerals tablet 1 tablet  1 tablet Oral Daily Opyd, Lavone Neri, MD   1 tablet at 05/12/17 1145  . sodium chloride flush (NS) 0.9 % injection 3 mL  3 mL Intravenous Q12H Opyd, Lavone Neri, MD   3 mL at 05/12/17 1000  . tamsulosin (FLOMAX) capsule 0.4 mg  0.4 mg Oral QPC breakfast Opyd, Lavone Neri, MD   0.4 mg at 05/12/17 1610  . vancomycin (VANCOCIN) 50 mg/mL oral solution 500 mg  500 mg Oral Q6H Regalado, Belkys A, MD   500 mg at 05/12/17 1145     Discharge Medications: Please see discharge summary for a list of discharge medications.  Relevant Imaging Results:  Relevant Lab Results:   Additional Information SS # [id number].  Jamaar Howes, Dickey Gave, LCSW

## 2017-05-12 NOTE — Evaluation (Signed)
SLP Cancellation Note  Patient Details Name: David SansRobert E [name] MRN: [id number] DOB: 1935-08-23   Cancelled treatment:       Reason Eval/Treat Not Completed: Other (comment) (pt declining to consume po at this time, resting quietly in bed, will continue efforts)  Donavan Burnetamara Evalise Abruzzese, MS [facility]CCC SLP [phone]  [name], Kaleisha Bhargava Ann 05/12/2017, 12:04 PM

## 2017-05-12 NOTE — Progress Notes (Signed)
PROGRESS NOTE    David [name]  WRU:[id number] DOB: Nov 04, 1934 DOA: 05/09/2017 PCP: Patient, No Pcp Per    Brief Narrative: David [name] is a 82 y.o. male with medical history significant for dementia, chronic dysarthria, COPD, and depression, now presenting to the emergency department for evaluation of diarrhea with loss of appetite and malaise. Patient is accompanied by his granddaughter who assists with the history. He has reportedly been suffering from intermittent loose stools for over a year now, but had been doing fairly well recently until this morning when he had 2 episodes of diarrhea associated with generalized weakness, loss of appetite, and a nonspecific malaise. Reports transient chills, but no fever. He had a similar episode 10 days ago, was evaluated in the emergency department at that time, he was suspected of having a UTI, and is now status post treatment with Keflex.   Patient admitted with C diff colitis and Delirium.   Assessment & Plan:   Principal Problem:   Colitis Active Problems:   Depression   Hyponatremia   Normocytic anemia   Dementia   Pancreatic lesion   Colitis presumed infectious   Occult blood positive stool   Diarrhea of presumed infectious origin   C diff Colitis, C diff positive - Pt presents with diarrhea, malaise, brief episode of chills, loss of appetite  - Has been on Keflex recently for suspected UTI   - CT findings suggest infectious colitis; no fever or leukocytosis, normal lactate  - Brown stool, positive for occult blood  - Blood culture , urine culture no growth, C diff assay positive, and GI pathogen panel; negative -C diff positive. Started oral vancomycin. Discontinue cipro.  -continue with oral vancomycin 500 Q 6 hours. Continue with flagyl.  -still with significant watery diarrhea, but WBC now trending down.   Early sepsis;  Lactic acid increase to 3. Lactic acid normalized.  Continue with IV fluids.  treatment for  C diff.    Metabolic acidosis; IV fluids.  Hypomagnesemia; received IV mg. Mg normal today  Corrected calcium at 8.    Acute hypoxic respiratory failure;  Concern with aspiration, will make him NP except for meds.  Swallow evaluation. Started on dysphagia 3 diet  Chest x ray ; emphysema.  He has some wheezing today, will schedule nebulizer. Continue with pulmicort.   Delirium;  Called by family that patient was notice to be confused, more lethargic. Not at baseline, he is more lethargic. Having tremors.  -ABG. Was negative for hypercapnia.  -Start CT head negative for stroke. EEG ; is abnormal due to moderate diffuse slowing of the background. - EKG, CBC, b-met, troponin negative .  -he also received gabapentin in am. That could had made him lethargic -ammonia norma, B-12.  -neurology consulted. Appreciate Neuro evaluation. They think patient has delirium. See neurology note. -resume gabapentin at  bedtime He is alert today, anwser questions, mild confusion.   Leukocytosis; related to C diff. Trending down.   Hyponatremia  - improved with IV fluids.   Hypokalemia; replete IV  Depression - Worse since death of his wife last month. He has good family support.  - Continue Celexa    Anemia, occult blood in stool ; hb stable.  - Hgb is 11.0 on admission, up from 10.0 earlier in the month - No gross blood in stool, but FOBT+  - Likely secondary to acute colitis, eval and tx as above  - VTE ppx with SCD's and repeat CBC in am    .  Incidental pancreatic lesion  - Noted on CT abd/pelvis  - Follow-up imaging in 2 years recommended as appropriate      DVT prophylaxis: SCD, occult blood positive Code Status: full code.  Family Communication: no family at bedside.  Disposition Plan: to be determine.    Consultants:   none   Procedures:   none   Antimicrobials:   Vancomycin oral and flagyl.    Subjective: He is alert, report mild abdominal pain.    Objective: Vitals:   05/12/17 0322 05/12/17 0400 05/12/17 0500 05/12/17 0800  BP:  (!) 145/91    Pulse:      Resp:  (!) 23    Temp: 99.2 F (37.3 C)   99.1 F (37.3 C)  TempSrc: Axillary   Axillary  SpO2:  99%    Weight:   67.1 kg (147 lb 14.9 oz)   Height:        Intake/Output Summary (Last 24 hours) at 05/12/17 0825 Last data filed at 05/12/17 0600  Gross per 24 hour  Intake          3454.59 ml  Output              750 ml  Net          2704.59 ml   Filed Weights   05/10/17 1527 05/11/17 0428 05/12/17 0500  Weight: 61.2 kg (134 lb 14.7 oz) 63.9 kg (140 lb 14 oz) 67.1 kg (147 lb 14.9 oz)    Examination:  General exam: NAD Respiratory system: Respiratory effort normal. Bilateral wheezing.  Cardiovascular system: s 1, S 2 RRR Gastrointestinal system:Abdomen is distended, mild tenderness, no rigidity  Central nervous system: alert, follows command,  Extremities: Symmetric 5 x 5 power.      Data Reviewed: I have personally reviewed following labs and imaging studies  CBC:  Recent Labs Lab 05/09/17 1320 05/10/17 0531 05/10/17 1423 05/11/17 0332 05/12/17 0311  WBC 10.1 18.8* 21.4* 19.9* 18.2*  NEUTROABS 8.5* 15.4*  --   --   --   HGB 11.0* 11.7* 12.0* 10.7* 10.9*  HCT 32.1* 34.3* 35.2* 31.0* 31.2*  MCV 90.7 89.1 89.6 89.9 88.9  PLT 170 198 190 174 504   Basic Metabolic Panel:  Recent Labs Lab 05/09/17 1320 05/10/17 0531 05/10/17 1423 05/11/17 0332 05/12/17 0311  NA 134* 135 136 135 139  K 3.7 3.3* 4.0 3.4* 3.5  CL 102 105 108 113* 115*  CO2 24 22 21* 18* 21*  GLUCOSE 123* 136* 143* 148* 135*  BUN _0 <5*  CREATININE 1.04 0.89 0.96 0.77 0.64  CALCIUM 8.7* 8.0* 7.8* 6.9* 7.5*  MG  --   --   --  1.6*  --    GFR: Estimated Creatinine Clearance: 66.5 mL/min (by C-G formula based on SCr of 0.64 mg/dL). Liver Function Tests:  Recent Labs Lab 05/09/17 1320 05/11/17 0057  AST 17 27  ALT 12* 13*  ALKPHOS 59 35*  BILITOT 0.5 0.2*   PROT 6.3* 4.6*  ALBUMIN 3.7 2.6*    Recent Labs Lab 05/09/17 1320  LIPASE 27    Recent Labs Lab 05/10/17 1605  AMMONIA 15   Coagulation Profile: No results for input(s): INR, PROTIME in the last 168 hours. Cardiac Enzymes:  Recent Labs Lab 05/09/17 1321 05/10/17 1423 05/10/17 1927 05/11/17 0057  TROPONINI <0.03 <0.03 <0.03 <0.03   BNP (last 3 results) No results for input(s): PROBNP in the last 8760 hours. HbA1C: No results for input(s): HGBA1C  in the last 72 hours. CBG:  Recent Labs Lab 05/10/17 0745 05/10/17 1340 05/11/17 0740  GLUCAP 126* 144* 155*   Lipid Profile: No results for input(s): CHOL, HDL, LDLCALC, TRIG, CHOLHDL, LDLDIRECT in the last 72 hours. Thyroid Function Tests:  Recent Labs  05/10/17 1927  TSH 3.383   Anemia Panel:  Recent Labs  05/10/17 1605  VITAMINB12 578   Sepsis Labs:  Recent Labs Lab 05/10/17 1605 05/10/17 1927 05/11/17 0057 05/11/17 1141  LATICACIDVEN 2.6* 3.3* 2.6* 1.1    Recent Results (from the past 240 hour(s))  Urine culture     Status: None   Collection Time: 05/09/17  1:22 PM  Result Value Ref Range Status   Specimen Description URINE, CATHETERIZED  Final   Special Requests Normal  Final   Culture   Final    NO GROWTH Performed at [facility], [place] [address] [place], [place] [address]    Report Status 05/11/2017 FINAL  Final  Blood culture (routine x 2)     Status: None (Preliminary result)   Collection Time: 05/09/17  1:40 PM  Result Value Ref Range Status   Specimen Description BLOOD RIGHT ARM  Final   Special Requests   Final    BOTTLES DRAWN AEROBIC AND ANAEROBIC Blood Culture adequate volume   Culture   Final    NO GROWTH 2 DAYS Performed at [facility], [place] [address] [place], [place] [address]    Report Status PENDING  Incomplete  C difficile quick scan w PCR reflex     Status: Abnormal   Collection Time: 05/09/17  3:00 PM  Result Value Ref Range Status   C Diff  antigen POSITIVE (A) NEGATIVE Final   C Diff toxin POSITIVE (A) NEGATIVE Final   C Diff interpretation Toxin producing C. difficile detected.  Final    Comment: CRITICAL RESULT CALLED TO, READ BACK BY AND VERIFIED WITH: D. [id number] 07.22.2018 N. MORRIS   Gastrointestinal Panel by PCR , Stool     Status: None   Collection Time: 05/09/17  4:34 PM  Result Value Ref Range Status   Campylobacter species NOT DETECTED NOT DETECTED Final   Plesimonas shigelloides NOT DETECTED NOT DETECTED Final   Salmonella species NOT DETECTED NOT DETECTED Final   Yersinia enterocolitica NOT DETECTED NOT DETECTED Final   Vibrio species NOT DETECTED NOT DETECTED Final   Vibrio cholerae NOT DETECTED NOT DETECTED Final   Enteroaggregative E coli (EAEC) NOT DETECTED NOT DETECTED Final   Enteropathogenic E coli (EPEC) NOT DETECTED NOT DETECTED Final   Enterotoxigenic E coli (ETEC) NOT DETECTED NOT DETECTED Final   Shiga like toxin producing E coli (STEC) NOT DETECTED NOT DETECTED Final   Shigella/Enteroinvasive E coli (EIEC) NOT DETECTED NOT DETECTED Final   Cryptosporidium NOT DETECTED NOT DETECTED Final   Cyclospora cayetanensis NOT DETECTED NOT DETECTED Final   Entamoeba histolytica NOT DETECTED NOT DETECTED Final   Giardia lamblia NOT DETECTED NOT DETECTED Final   Adenovirus F40/41 NOT DETECTED NOT DETECTED Final   Astrovirus NOT DETECTED NOT DETECTED Final   Norovirus GI/GII NOT DETECTED NOT DETECTED Final   Rotavirus A NOT DETECTED NOT DETECTED Final   Sapovirus (I, II, IV, and V) NOT DETECTED NOT DETECTED Final  MRSA PCR Screening     Status: None   Collection Time: 05/10/17  3:29 PM  Result Value Ref Range Status   MRSA by PCR NEGATIVE NEGATIVE Final    Comment:  The GeneXpert MRSA Assay (FDA approved for NASAL specimens only), is one component of a comprehensive MRSA colonization surveillance program. It is not intended to diagnose MRSA infection nor to guide or monitor treatment  for MRSA infections.          Radiology Studies: Ct Head Wo Contrast  Result Date: 05/10/2017 CLINICAL DATA:  Advanced and dementia.  Colitis. EXAM: CT HEAD WITHOUT CONTRAST TECHNIQUE: Contiguous axial images were obtained from the base of the skull through the vertex without intravenous contrast. COMPARISON:  CT 04/29/2017 FINDINGS: Brain: Advanced generalized brain atrophy. Advanced chronic small-vessel ischemic changes of the cerebral hemispheric white matter. Old small vessel infarctions of the basal ganglia and thalami. No sign of acute infarction, mass lesion, hemorrhage, hydrocephalus or extra-axial collection. Vascular: There is atherosclerotic calcification of the major vessels at the base of the brain. Skull: Normal Sinuses/Orbits: Clear/normal Other: None significant IMPRESSION: No acute or reversible finding by CT. Advanced generalized atrophy with chronic small-vessel ischemic changes throughout the brain. Electronically Signed   By: Nelson Chimes M.D.   On: 05/10/2017 15:04   Dg Chest Port 1 View  Result Date: 05/10/2017 CLINICAL DATA:  Increased dyspnea and weakness this morning EXAM: PORTABLE CHEST 1 VIEW COMPARISON:  Portable exam 1113 hours compared to 05/09/2017 FINDINGS: Normal heart size, mediastinal contours, and pulmonary vascularity. Atherosclerotic calcifications aorta. Emphysematous changes without infiltrate, pleural effusion or pneumothorax. Calcified adenopathy at LEFT hilum. Bones demineralized. IMPRESSION: Emphysematous and old granulomatous disease changes. No acute abnormalities. Aortic Atherosclerosis (ICD10-I70.0) and Emphysema (ICD10-J43.9). Electronically Signed   By: Lavonia Dana M.D.   On: 05/10/2017 11:26        Scheduled Meds: . calcium carbonate  1 tablet Oral BID WC  . citalopram  20 mg Oral Daily  . finasteride  5 mg Oral Daily  . gabapentin  300 mg Oral QHS  . loratadine  10 mg Oral Daily  . multivitamin with minerals  1 tablet Oral Daily  .  sodium chloride flush  3 mL Intravenous Q12H  . tamsulosin  0.4 mg Oral QPC breakfast  . vancomycin  500 mg Oral Q6H   Continuous Infusions: . dextrose 5 % and 0.9% NaCl 100 mL/hr at 05/12/17 0245  . metronidazole 500 mg (05/12/17 0811)     LOS: 3 days    Time spent: 35 minutes.     Elmarie Shiley, MD [facility] Pager [phone]  If 7PM-7AM, please contact night-coverage [email or web address] Password [email or web address] 05/12/2017, 8:25 AM

## 2017-05-13 LAB — GLUCOSE, CAPILLARY: Glucose-Capillary: 115 mg/dL — ABNORMAL HIGH (ref 65–99)

## 2017-05-13 MED ORDER — METOPROLOL TARTRATE 5 MG/5ML IV SOLN
2.5000 mg | Freq: Once | INTRAVENOUS | Status: AC
  Administered 2017-05-13: 2.5 mg via INTRAVENOUS
  Filled 2017-05-13: qty 5

## 2017-05-13 NOTE — Progress Notes (Signed)
Pt w/ prior hx of a-fib had been fluctuating between NSR and a-fib, rates 60-80's. Now sustained a-fib, rates 100-130's. Notified K. Schorr, [facility]. Will continue to monitor.

## 2017-05-13 NOTE — Progress Notes (Signed)
Pt EKG showed a-fib w/ RVR. Notified K. Schorr, w/ [facility] and received order for 2.5mg  IV metoprolol. HR currently 90's-100's. Not c/o pain or discomfort. Family updated on plan of care. Will continue to monitor.

## 2017-05-13 NOTE — Progress Notes (Signed)
CSW spoke with pt's granddaughter this afternoon to assist with d/c planning. Granddaughter was present during PT treatment today and feels [facility] Granddaughter is declining SNF at this time. SNF bed offers are available if plan changes and SNF is required.   CSW signing off.  Cori RazorJamie Demarcus Thielke LCSW [phone]

## 2017-05-13 NOTE — Progress Notes (Signed)
  Speech Language Pathology Treatment: Dysphagia  Patient Details Name: David SansRobert E Groleau MRN: [id number] DOB: December 21, 1934 Today's Date: 05/13/2017 Time: [id number] SLP Time Calculation (min) (ACUTE ONLY): 20 min  Assessment / Plan / Recommendation Clinical Impression  Pt seen at bedside for assessment of diet tolerance. RN and SLP repositioned pt in recliner. Pt accepted trials of graham cracker, applesauce, and water. No overt s/s aspiration observed or reported on any consistency. Pt now with dentures in place. Recommend Dys 3 solids with chopped meats, thin liquids. ST will continue to follow to assess advanced diet tolerance and education as needed. RN aware.    HPI HPI: 82 y.o.malewith medical history significant for dementia, chronic dysarthria, COPD, and depression, now presenting to the emergency department for evaluation of diarrhea with loss of appetite and malaise. Dx colitis, acute hypoxic respiratory failure with concerns for aspiration, hyponatremia, hypokalemia.  He recently lost his wife, who passed a month ago.       SLP Plan  Continue with current plan of care    Recommendations  Diet recommendations: Dysphagia 3 (mechanical soft);Thin liquid (chop meats) Liquids provided via: Cup;Straw Medication Administration: Whole meds with puree Supervision: Intermittent supervision to cue for compensatory strategies;Patient able to self feed Compensations: Slow rate;Small sips/bites;Minimize environmental distractions Postural Changes and/or Swallow Maneuvers: Seated upright 90 degrees;Upright 30-60 min after meal               Oral Care Recommendations: Oral care BID Follow up Recommendations:  (TBD) SLP Visit Diagnosis: Dysphagia, oropharyngeal phase (R13.12) Plan: Continue with current plan of care       Bardia Wangerin B. [place] Center[place], [facility] NevadaMSP, CCC-SLP [phone]  Leigh AuroraBueche, Oneika Simonian Brown 05/13/2017, 1:48 PM

## 2017-05-13 NOTE — Progress Notes (Signed)
PROGRESS NOTE [facility]   David [name]   WUJ:[id number] DOB: [birth year]  DOA: 05/09/2017 PCP: Patient, No Pcp Per   Brief Narrative:  82 year old male with medical history significant for dementia, chronic dysarthria, COPD and depression presented to the emergency department complaining of diarrhea. Patient was found to have C. difficile colitis and admitted for antibiotic treatment. Hospital course has been complicated with delirium, neurological evaluation was done which was negative.  Subjective: Patient seen and examined with RN at bedside. Patient having some breakfast, he reports some mild abdominal pain although feels that has mild improvement. Diarrhea per RN is improving. Denies nausea, vomiting, chest pain or shortness of breath.  Assessment & Plan: C diff Colitis - Diarrhea slowly improving  Pt presented with diarrhea, malaise, brief episode of chills, loss of appetite  Was on Keflex recently for suspected UTI  CT findings suggest infectious colitis; no fever or leukocytosis, normal lactate  Initially treated with Cipro and Flagyl. Blood culture and urine culture no growth, C diff assay positive, and GI pathogen panel; negative Started oral vancomycin. Cipro d/ced.  Continue with oral vancomycin 500 Q 6 hours. Continue with flagyl.  Diarrhea slowly improving, white count continues to improve.  Sepsis 2/2 to C diff colitis  Sepsis physiology has resolved  See above   Metabolic acidosis; secondary to GI diagnosis has improved with IV fluids .   Hypomagnesemia; received IV mg. Resolved  Corrected calcium at 8.8  Acute hypoxic respiratory failure; - Improved  Concern of aspiration, CXR shows only emphysema  Swallow evaluation recommending dysphagia 3 diet   Wean O2 as tolerated Continue to monitor   Delirium due to infectious process  Neurology was consulted which getting with delirium diagnosis Family reporting the patient is having tremors. - non  visualized by me, could be myoclonus in setting of severe illness ABG. Was negative for hypercapnia.  CT head negative for stroke. EEG ;is abnormal due to moderatediffuse slowing of the background. EKG and troponin were negative .  Alert today, confused due to dementia   Leukocytosis; related to C diff. Trending down, continue to monitor   Hyponatremia - resolved Treated with IV fluids  Hypokalemia; replete IV - resolved  Depression Has good family support.  Continue Celexa   Anemia, occult blood in stool ; hb stable.  Hgb is 11.0 on admission, up from 10.0 earlier in the month No gross blood in stool, but FOBT+  Likely secondary to acute colitis  Incidental pancreatic lesion  Noted on CT abd/pelvis  Follow-up imaging in 2 years recommended as appropriate   DVT prophylaxis: SCDs Code Status: Full code Family Communication: Granddaughter at bedside Disposition Plan: Keep in stepdown if stable can transfer to MedSurg in the morning.  Consultants:   None  Procedures:   EEG 05/11/17  Antimicrobials: Anti-infectives    Start     Dose/Rate Route Frequency Ordered Stop   05/10/17 1800  vancomycin (VANCOCIN) 50 mg/mL oral solution 500 mg  Status:  Discontinued     500 mg Oral Every 6 hours 05/10/17 1548 05/10/17 1548   05/10/17 1600  vancomycin (VANCOCIN) 50 mg/mL oral solution 500 mg     500 mg Oral Every 6 hours 05/10/17 1548 05/24/17 1759   05/10/17 0830  vancomycin (VANCOCIN) 50 mg/mL oral solution 125 mg  Status:  Discontinued     125 mg Oral Every 6 hours 05/10/17 0753 05/10/17 1547   05/10/17 0800  ciprofloxacin (CIPRO) IVPB 400 mg  Status:  Discontinued  400 mg 200 mL/hr over 60 Minutes Intravenous Every 12 hours 05/09/17 2044 05/10/17 0753   05/10/17 0000  metroNIDAZOLE (FLAGYL) IVPB 500 mg     500 mg 100 mL/hr over 60 Minutes Intravenous Every 8 hours 05/09/17 2044     05/09/17 1615  ciprofloxacin (CIPRO) IVPB 400 mg     400 mg 200 mL/hr over 60  Minutes Intravenous  Once 05/09/17 1600 05/09/17 2100   05/09/17 1615  metroNIDAZOLE (FLAGYL) IVPB 500 mg     500 mg 100 mL/hr over 60 Minutes Intravenous  Once 05/09/17 1600 05/09/17 1756        Objective: Vitals:   05/13/17 0600 05/13/17 0700 05/13/17 0724 05/13/17 0819  BP: (!) 155/79 (!) 155/83    Pulse:      Resp: 18 14    Temp:   98.1 F (36.7 C)   TempSrc:   Oral   SpO2: 99% 100%  97%  Weight:      Height:        Intake/Output Summary (Last 24 hours) at 05/13/17 0929 Last data filed at 05/13/17 0600  Gross per 24 hour  Intake             2700 ml  Output             1325 ml  Net             1375 ml   Filed Weights   05/11/17 0428 05/12/17 0500 05/13/17 0500  Weight: 63.9 kg (140 lb 14 oz) 67.1 kg (147 lb 14.9 oz) 68.7 kg (151 lb 7.3 oz)    Examination:  General exam: Appears calm and comfortable  HEENT: OP moist and clear Respiratory system: Anterior auscultation, air entry diminish b/l, no crackles, rales or wheezing noted.  Cardiovascular system: S1 & S2 heard, RRR. No JVD, murmurs, rubs or gallops Gastrointestinal system: Abdomen is mild distended, mild diffuse tenderness to palpation  Central nervous system: Alert, oriented to person and place, follow commands  Extremities: No LE pedal edema. Skin: No rashes, lesions or ulcers  Data Reviewed: I have personally reviewed following labs and imaging studies  CBC:  Recent Labs Lab 05/09/17 1320 05/10/17 0531 05/10/17 1423 05/11/17 0332 05/12/17 0311  WBC 10.1 18.8* 21.4* 19.9* 18.2*  NEUTROABS 8.5* 15.4*  --   --   --   HGB 11.0* 11.7* 12.0* 10.7* 10.9*  HCT 32.1* 34.3* 35.2* 31.0* 31.2*  MCV 90.7 89.1 89.6 89.9 88.9  PLT 170 198 190 174 169   Basic Metabolic Panel:  Recent Labs Lab 05/10/17 0531 05/10/17 1423 05/11/17 0332 05/12/17 0311 05/12/17 1309  NA 135 136 135 139 140  K 3.3* 4.0 3.4* 3.5 3.6  CL 105 108 113* 115* 113*  CO2 22 21* 18* 21* 21*  GLUCOSE 136* 143* 148* 135* 144*    BUN 12 12 9  <5* <5*  CREATININE 0.89 0.96 0.77 0.64 0.69  CALCIUM 8.0* 7.8* 6.9* 7.5* 7.8*  MG  --   --  1.6*  --  2.0   GFR: Estimated Creatinine Clearance: 66.5 mL/min (by C-G formula based on SCr of 0.69 mg/dL). Liver Function Tests:  Recent Labs Lab 05/09/17 1320 05/11/17 0057 05/12/17 1309  AST 17 27  --   ALT 12* 13*  --   ALKPHOS 59 35*  --   BILITOT 0.5 0.2*  --   PROT 6.3* 4.6*  --   ALBUMIN 3.7 2.6* 2.8*    Recent Labs Lab 05/09/17 1320  LIPASE 27    Recent Labs Lab 05/10/17 1605  AMMONIA 15   Coagulation Profile: No results for input(s): INR, PROTIME in the last 168 hours. Cardiac Enzymes:  Recent Labs Lab 05/09/17 1321 05/10/17 1423 05/10/17 1927 05/11/17 0057  TROPONINI <0.03 <0.03 <0.03 <0.03   BNP (last 3 results) No results for input(s): PROBNP in the last 8760 hours. HbA1C: No results for input(s): HGBA1C in the last 72 hours. CBG:  Recent Labs Lab 05/10/17 0745 05/10/17 1340 05/11/17 0740 05/12/17 0724 05/13/17 0845  GLUCAP 126* 144* 155* 127* 115*   Lipid Profile: No results for input(s): CHOL, HDL, LDLCALC, TRIG, CHOLHDL, LDLDIRECT in the last 72 hours. Thyroid Function Tests:  Recent Labs  05/10/17 1927  TSH 3.383   Anemia Panel:  Recent Labs  05/10/17 1605  VITAMINB12 578   Sepsis Labs:  Recent Labs Lab 05/10/17 1605 05/10/17 1927 05/11/17 0057 05/11/17 1141  LATICACIDVEN 2.6* 3.3* 2.6* 1.1    Recent Results (from the past 240 hour(s))  Urine culture     Status: None   Collection Time: 05/09/17  1:22 PM  Result Value Ref Range Status   Specimen Description URINE, CATHETERIZED  Final   Special Requests Normal  Final   Culture   Final    NO GROWTH Performed at [facility], [address] [place], [place] [address]    Report Status 05/11/2017 FINAL  Final  Blood culture (routine x 2)     Status: None (Preliminary result)   Collection Time: 05/09/17  1:40 PM  Result Value Ref Range Status    Specimen Description BLOOD RIGHT ARM  Final   Special Requests   Final    BOTTLES DRAWN AEROBIC AND ANAEROBIC Blood Culture adequate volume   Culture   Final    NO GROWTH 3 DAYS Performed at [facility], [address] [place], [place] [address]    Report Status PENDING  Incomplete  C difficile quick scan w PCR reflex     Status: Abnormal   Collection Time: 05/09/17  3:00 PM  Result Value Ref Range Status   C Diff antigen POSITIVE (A) NEGATIVE Final   C Diff toxin POSITIVE (A) NEGATIVE Final   C Diff interpretation Toxin producing C. difficile detected.  Final    Comment: CRITICAL RESULT CALLED TO, READ BACK BY AND VERIFIED WITH: D. [email or web address] [id number] 07.22.2018 N. MORRIS   Gastrointestinal Panel by PCR , Stool     Status: None   Collection Time: 05/09/17  4:34 PM  Result Value Ref Range Status   Campylobacter species NOT DETECTED NOT DETECTED Final   Plesimonas shigelloides NOT DETECTED NOT DETECTED Final   Salmonella species NOT DETECTED NOT DETECTED Final   Yersinia enterocolitica NOT DETECTED NOT DETECTED Final   Vibrio species NOT DETECTED NOT DETECTED Final   Vibrio cholerae NOT DETECTED NOT DETECTED Final   Enteroaggregative E coli (EAEC) NOT DETECTED NOT DETECTED Final   Enteropathogenic E coli (EPEC) NOT DETECTED NOT DETECTED Final   Enterotoxigenic E coli (ETEC) NOT DETECTED NOT DETECTED Final   Shiga like toxin producing E coli (STEC) NOT DETECTED NOT DETECTED Final   Shigella/Enteroinvasive E coli (EIEC) NOT DETECTED NOT DETECTED Final   Cryptosporidium NOT DETECTED NOT DETECTED Final   Cyclospora cayetanensis NOT DETECTED NOT DETECTED Final   Entamoeba histolytica NOT DETECTED NOT DETECTED Final   Giardia lamblia NOT DETECTED NOT DETECTED Final   Adenovirus F40/41 NOT DETECTED NOT DETECTED Final   Astrovirus  NOT DETECTED NOT DETECTED Final   Norovirus GI/GII NOT DETECTED NOT DETECTED Final   Rotavirus A NOT DETECTED NOT DETECTED Final   Sapovirus (I, II, IV,  and V) NOT DETECTED NOT DETECTED Final  MRSA PCR Screening     Status: None   Collection Time: 05/10/17  3:29 PM  Result Value Ref Range Status   MRSA by PCR NEGATIVE NEGATIVE Final    Comment:        The GeneXpert MRSA Assay (FDA approved for NASAL specimens only), is one component of a comprehensive MRSA colonization surveillance program. It is not intended to diagnose MRSA infection nor to guide or monitor treatment for MRSA infections.      Radiology Studies: No results found.   Scheduled Meds: . budesonide (PULMICORT) nebulizer solution  0.25 mg Nebulization BID  . calcium carbonate  1 tablet Oral BID WC  . citalopram  20 mg Oral Daily  . feeding supplement (ENSURE ENLIVE)  237 mL Oral BID BM  . finasteride  5 mg Oral Daily  . gabapentin  300 mg Oral QHS  . ipratropium-albuterol  3 mL Nebulization Q12H  . loratadine  10 mg Oral Daily  . multivitamin with minerals  1 tablet Oral Daily  . sodium chloride flush  3 mL Intravenous Q12H  . tamsulosin  0.4 mg Oral QPC breakfast  . vancomycin  500 mg Oral Q6H   Continuous Infusions: . dextrose 5 % and 0.9% NaCl 100 mL/hr at 05/13/17 0600  . metronidazole Stopped (05/13/17 0915)     LOS: 4 days    Time spent: Total of 25 minutes spent with pt, greater than 50% of which was spent in discussion of  treatment, counseling and coordination of care   Latrelle DodrillEdwin Silva, MD Pager: Text Page via [email or web address]  [phone]  If 7PM-7AM, please contact night-coverage [email or web address] Password [email or web address] 05/13/2017, 9:29 AM

## 2017-05-13 NOTE — Progress Notes (Signed)
OT Cancellation Note  Patient Details Name: David SansRobert E Luera MRN: [id number] DOB: 1935/08/11   Cancelled Treatment:    Reason Eval/Treat Not Completed: Other (comment).  Checked on pt twice this am. He needed to be cleaned up the first time and is now sleeping soundly. Will return as schedule permits.  Gaila Engebretsen 05/13/2017, 10:10 AM  Marica OtterMaryellen Eurydice Calixto, OTR/L [phone] 05/13/2017

## 2017-05-13 NOTE — Evaluation (Signed)
Occupational Therapy Evaluation Patient Details Name: David SansRobert E [name] MRN: [id number] DOB: 1934/11/19 Today's Date: 05/13/2017    History of Present Illness David [name] is a 82 y.o. male with medical history significant for dementia, chronic dysarthria, COPD, and depression, now presenting to the emergency department for evaluation of diarrhea with loss of appetite and malaise. positive for C Diff., recent treatment for suspected UTI.   Clinical Impression   Pt was admitted for the above.  He was living at home with family prior to admission and he fed himself and ambulated to bathroom with RW and min guard assistance.  Pt currently needs max +2 assistance to stand and mod +2 for SPT.  He will benefit from continued OT. Goals are for min to mod +2 assistance for mobility and self feeding with occasional min A    Follow Up Recommendations  Supervision/Assistance - 24 hour;SNF;Home health OT (family wants to take pt pt home)    Equipment Recommendations   (possibly 3:1 depending upon progress.  Pt has toilet riser)    Recommendations for Other Services       Precautions / Restrictions Precautions Precautions: Fall Restrictions Weight Bearing Restrictions: No      Mobility Bed Mobility Overal bed mobility: Needs Assistance Bed Mobility: Supine to Sit     Supine to sit: Max assist;+2 for physical assistance     General bed mobility comments: assist for legs and trunk.  Pt with posterior lean  Transfers Overall transfer level: Needs assistance Equipment used: Rolling walker (2 wheeled) Transfers: Sit to/from UGI CorporationStand;Stand Pivot Transfers Sit to Stand: Max assist;+2 physical assistance Stand pivot transfers: Min assist;+2 physical assistance       General transfer comment: assist to rise and stabilize.  pt with posterior lean when standing initially.  multimodal cues for hand placement; pt does not like to release hand on RW to reach for chair arms    Balance        Sitting balance - Comments: pt with initial posterior lean.  Once assisted to upright, he was initially wobbly.  Min guard for safety     Standing balance-Leahy Scale: Poor                             ADL either performed or assessed with clinical judgement   ADL Overall ADL's : Needs assistance/impaired Eating/Feeding: Maximal assistance;Sitting                       Toilet Transfer: Moderate assistance;+2 for physical assistance;Stand-pivot;RW (to recliner; max +2 to stand)             General ADL Comments: family assists with all adls at baseline     Vision         Perception     Praxis      Pertinent Vitals/Pain Pain Assessment: Faces Faces Pain Scale: No hurt     Hand Dominance     Extremity/Trunk Assessment Upper Extremity Assessment Upper Extremity Assessment: Generalized weakness           Communication Communication Communication: No difficulties   Cognition Arousal/Alertness: Awake/alert Behavior During Therapy: WFL for tasks assessed/performed Overall Cognitive Status: History of cognitive impairments - at baseline                                     General  Comments       Exercises     Shoulder Instructions      Home Living Family/patient expects to be discharged to:: Private residence Living Arrangements: Children;Other relatives Available Help at Discharge: Family                   Bathroom Toilet: Standard     Home Equipment: Dan HumphreysWalker - 2 wheels;Toilet riser          Prior Functioning/Environment Level of Independence: Needs assistance        Comments: pt was walking to bathroom with walker with assistance. Feeds self at baseline. Family helps with adls (extensively)        OT Problem List: Decreased strength;Decreased activity tolerance;Impaired balance (sitting and/or standing);Decreased cognition;Decreased safety awareness;Decreased knowledge of use of DME or AE       OT Treatment/Interventions: Self-care/ADL training;DME and/or AE instruction;Balance training;Patient/family education;Therapeutic activities;Cognitive remediation/compensation    OT Goals(Current goals can be found in the care plan section) Acute Rehab OT Goals Patient Stated Goal: family:  take pt home OT Goal Formulation: With family Time For Goal Achievement: 05/27/17 Potential to Achieve Goals: Fair ADL Goals Pt Will Transfer to Toilet: with min assist;with +2 assist;ambulating;bedside commode Additional ADL Goal #1: pt will go from sit to stand with mod A +2 and maintain for 2 minutes for adls with min A Additional ADL Goal #2: pt will self feed with set up and occasional min A for 1/2 of intake  OT Frequency: Min 2X/week   Barriers to D/C:            Co-evaluation PT/OT/SLP Co-Evaluation/Treatment: Yes Reason for Co-Treatment: For patient/therapist safety PT goals addressed during session: Mobility/safety with mobility OT goals addressed during session: ADL's and self-care      AM-PAC PT "6 Clicks" Daily Activity     Outcome Measure Help from another person eating meals?: A Lot Help from another person taking care of personal grooming?: A Lot Help from another person toileting, which includes using toliet, bedpan, or urinal?: Total Help from another person bathing (including washing, rinsing, drying)?: A Lot Help from another person to put on and taking off regular upper body clothing?: A Lot Help from another person to put on and taking off regular lower body clothing?: Total 6 Click Score: 10   End of Session    Activity Tolerance: Patient tolerated treatment well Patient left: in chair;with call bell/phone within reach;with chair alarm set;with family/visitor present  OT Visit Diagnosis: Unsteadiness on feet (R26.81);Muscle weakness (generalized) (M62.81)                Time: [id number] OT Time Calculation (min): 22 min Charges:  OT General Charges $OT Visit: 1  Procedure OT Evaluation $OT Eval Moderate Complexity: 1 Procedure G-Codes:     [place] BeachMaryellen Alila Sotero, OTR/L [phone] 05/13/2017  David [name] 05/13/2017, 12:49 PM

## 2017-05-13 NOTE — Progress Notes (Signed)
Physical Therapy Treatment Patient Details Name: David [name] MRN: [id number] DOB: 17-Nov-1934 Today's Date: 05/13/2017    History of Present Illness David [name] is a 82 y.o. male with medical history significant for dementia, chronic dysarthria, COPD, and depression, now presenting to the emergency department for evaluation of diarrhea with loss of appetite and malaise. positive for C Diff., recent treatment for suspected UTI.    PT Comments    The patient's family present for session. The patient was not combative as previous. Family wants to take patient home. Continue PT.   Follow Up Recommendations  SNF;Supervision/Assistance - 24 hour     Equipment Recommendations  None recommended by PT    Recommendations for Other Services       Precautions / Restrictions Precautions Precautions: Fall Precaution Comments: c diff. flexiseal Restrictions Weight Bearing Restrictions: No    Mobility  Bed Mobility Overal bed mobility: Needs Assistance Bed Mobility: Supine to Sit     Supine to sit: Max assist;+2 for physical assistance     General bed mobility comments: assist for legs and trunk.  Pt with posterior lean  Transfers Overall transfer level: Needs assistance Equipment used: Rolling walker (2 wheeled) Transfers: Sit to/from UGI CorporationStand;Stand Pivot Transfers Sit to Stand: Max assist;+2 physical assistance Stand pivot transfers: Min assist;+2 physical assistance       General transfer comment: assist to rise and stabilize.  pt with posterior lean when standing initially.  multimodal cues for hand placement; pt does not like to release hand on RW to reach for chair arms  Ambulation/Gait                 Stairs            Wheelchair Mobility    Modified Rankin (Stroke Patients Only)       Balance Overall balance assessment: Needs assistance Sitting-balance support: No upper extremity supported Sitting balance-Leahy Scale: Fair Sitting  balance - Comments: pt with initial posterior lean.  Once assisted to upright, he was initially wobbly.  Min guard for safety   Standing balance support: Bilateral upper extremity supported Standing balance-Leahy Scale: Poor                              Cognition Arousal/Alertness: Awake/alert Behavior During Therapy: WFL for tasks assessed/performed Overall Cognitive Status: History of cognitive impairments - at baseline                                        Exercises      General Comments        Pertinent Vitals/Pain Pain Assessment: Faces Faces Pain Scale: No hurt    Home Living Family/patient expects to be discharged to:: Private residence Living Arrangements: Children;Other relatives Available Help at Discharge: Family         Home Equipment: Dan HumphreysWalker - 2 wheels;Toilet riser      Prior Function Level of Independence: Needs assistance      Comments: pt was walking to bathroom with walker with assistance. Feeds self at baseline. Family helps with adls (extensively)   PT Goals (current goals can now be found in the care plan section) Acute Rehab PT Goals Patient Stated Goal: family:  take pt home Progress towards PT goals: Progressing toward goals    Frequency    Min 3X/week  PT Plan Current plan remains appropriate    Co-evaluation PT/OT/SLP Co-Evaluation/Treatment: Yes Reason for Co-Treatment: For patient/therapist safety PT goals addressed during session: Mobility/safety with mobility OT goals addressed during session: ADL's and self-care      AM-PAC PT "6 Clicks" Daily Activity  Outcome Measure  Difficulty turning over in bed (including adjusting bedclothes, sheets and blankets)?: Total Difficulty moving from lying on back to sitting on the side of the bed? : Total Difficulty sitting down on and standing up from a chair with arms (e.g., wheelchair, bedside commode, etc,.)?: Total Help needed moving to and from a  bed to chair (including a wheelchair)?: Total Help needed walking in hospital room?: Total Help needed climbing 3-5 steps with a railing? : Total 6 Click Score: 6    End of Session Equipment Utilized During Treatment: Gait belt Activity Tolerance: Patient tolerated treatment well Patient left: in chair;with call bell/phone within reach;with family/visitor present;with chair alarm set Nurse Communication: Mobility status PT Visit Diagnosis: Unsteadiness on feet (R26.81);Other abnormalities of gait and mobility (R26.89)     Time: 2841-32441152-1221 PT Time Calculation (min) (ACUTE ONLY): 29 min  Charges:  $Therapeutic Activity: 8-22 mins                    G CodesBlanchard [name]:       David [name] PT [phone]    David [name], David [name] David [name] 05/13/2017, 1:17 PM

## 2017-05-14 ENCOUNTER — Inpatient Hospital Stay (HOSPITAL_COMMUNITY): Payer: Medicare Other

## 2017-05-14 DIAGNOSIS — A0472 Enterocolitis due to Clostridium difficile, not specified as recurrent: Secondary | ICD-10-CM

## 2017-05-14 LAB — CBC WITH DIFFERENTIAL/PLATELET
BASOS ABS: 0 10*3/uL (ref 0.0–0.1)
BASOS PCT: 0 %
EOS PCT: 2 %
Eosinophils Absolute: 0.2 10*3/uL (ref 0.0–0.7)
HCT: 30.4 % — ABNORMAL LOW (ref 39.0–52.0)
Hemoglobin: 10.7 g/dL — ABNORMAL LOW (ref 13.0–17.0)
Lymphocytes Relative: 15 %
Lymphs Abs: 2 10*3/uL (ref 0.7–4.0)
MCH: 30.3 pg (ref 26.0–34.0)
MCHC: 35.2 g/dL (ref 30.0–36.0)
MCV: 86.1 fL (ref 78.0–100.0)
MONO ABS: 1.4 10*3/uL — AB (ref 0.1–1.0)
Monocytes Relative: 11 %
Neutro Abs: 9.8 10*3/uL — ABNORMAL HIGH (ref 1.7–7.7)
Neutrophils Relative %: 72 %
PLATELETS: 174 10*3/uL (ref 150–400)
RBC: 3.53 MIL/uL — ABNORMAL LOW (ref 4.22–5.81)
RDW: 14.2 % (ref 11.5–15.5)
WBC: 13.5 10*3/uL — ABNORMAL HIGH (ref 4.0–10.5)

## 2017-05-14 LAB — BASIC METABOLIC PANEL
Anion gap: 4 — ABNORMAL LOW (ref 5–15)
CALCIUM: 7.7 mg/dL — AB (ref 8.9–10.3)
CO2: 21 mmol/L — ABNORMAL LOW (ref 22–32)
CREATININE: 0.61 mg/dL (ref 0.61–1.24)
Chloride: 111 mmol/L (ref 101–111)
GFR calc Af Amer: >60 mL/min (ref >60)
GLUCOSE: 130 mg/dL — AB (ref 65–99)
Potassium: 3.1 mmol/L — ABNORMAL LOW (ref 3.5–5.1)
Sodium: 136 mmol/L (ref 135–145)

## 2017-05-14 LAB — MAGNESIUM: Magnesium: 1.7 mg/dL (ref 1.7–2.4)

## 2017-05-14 LAB — CULTURE, BLOOD (ROUTINE X 2)
Culture: NO GROWTH
Special Requests: ADEQUATE

## 2017-05-14 LAB — GLUCOSE, CAPILLARY: Glucose-Capillary: 117 mg/dL — ABNORMAL HIGH (ref 65–99)

## 2017-05-14 MED ORDER — METRONIDAZOLE 500 MG PO TABS
500.0000 mg | ORAL_TABLET | Freq: Three times a day (TID) | ORAL | Status: DC
  Administered 2017-05-14 – 2017-05-16 (×7): 500 mg via ORAL
  Filled 2017-05-14 (×7): qty 1

## 2017-05-14 MED ORDER — POTASSIUM CHLORIDE CRYS ER 20 MEQ PO TBCR
40.0000 meq | EXTENDED_RELEASE_TABLET | Freq: Two times a day (BID) | ORAL | Status: DC
  Administered 2017-05-14 – 2017-05-16 (×5): 40 meq via ORAL
  Filled 2017-05-14 (×6): qty 2

## 2017-05-14 NOTE — Progress Notes (Signed)
PROGRESS NOTE [facility]   [name]   ZOX:[id number] DOB: May 04, 1935  DOA: 05/09/2017 PCP: David [name], No Pcp Per   Brief Narrative:  82 year old male with medical history significant for dementia, chronic dysarthria, COPD and depression presented to the emergency department complaining of diarrhea. David [name] was found to have C. difficile colitis and admitted for antibiotic treatment. Hospital course has been complicated with delirium, neurological evaluation was done which was negative.  Subjective: David [name] seen and examined, mild SOB today, David [name] went in to Afib over night ? New diagnosis. Diarrhea continues to improve, WBC trending down.   Assessment & Plan: C diff Colitis - continues to slowly improving  Pt presented with diarrhea, malaise, brief episode of chills, loss of appetite  Was on Keflex recently for suspected UTI  CT findings suggest infectious colitis; no fever or leukocytosis, normal lactate  Initially treated with Cipro and Flagyl. Blood culture and urine culture no growth, C diff assay positive, and GI pathogen panel; negative Started oral vancomycin. Cipro d/ced.  Continue with oral vancomycin 500 Q 6 hours. Continue with flagyl can switch to oral  Diarrhea improving, WBC down  Ok to transfer to telemetry   Sepsis 2/2 to C diff colitis  Sepsis physiology has resolved  See above   Episode of Afib - converted to NSR Was given metoprolol overnight with good HR control  Will keep  Will obtain cardiology consult   Metabolic acidosis; secondary to GI diagnosis has improved with IV fluids .   Hypomagnesemia; received IV mg. Resolved  Corrected calcium at 8.8  Acute hypoxic respiratory failure; - SOB today with diffuse exp wheezing ? Fluid overload Continue nebulizer PRN  Will obtain CXR if sign of overload - will add Lasix  Swallow evaluation recommending dysphagia 3 diet   Wean O2 as tolerated Continue to monitor   Delirium due to  infectious process - Improved  Neurology was consulted which getting with delirium diagnosis Family reporting the David [name] is having tremors. - non visualized by me, could be myoclonus in setting of severe illness ABG. Was negative for hypercapnia.  CT head negative for stroke. EEG;is abnormal due to moderatediffuse slowing of the background. EKG and troponin were negative .   Hyponatremia - resolved Treated with IV fluids  Hypokalemia due to GI losses Replete Check Mag  Depression - stable  Continue Celexa   Anemia, occult blood in stool, Hgb remains stable  Hgb is 11.0 on admission No gross blood in stool, but FOBT+  Likely secondary to acute colitis   Incidental pancreatic lesion  Noted on CT abd/pelvis  Follow-up imaging in 2 years recommended as appropriate   DVT prophylaxis: SCDs Code Status: Full code Family Communication: None at bedside  Disposition Plan: Can transfer to tele   Consultants:   None  Procedures:   EEG 05/11/17  Antimicrobials: Anti-infectives    Start     Dose/Rate Route Frequency Ordered Stop   05/10/17 1800  vancomycin (VANCOCIN) 50 mg/mL oral solution 500 mg  Status:  Discontinued     500 mg Oral Every 6 hours 05/10/17 1548 05/10/17 1548   05/10/17 1600  vancomycin (VANCOCIN) 50 mg/mL oral solution 500 mg     500 mg Oral Every 6 hours 05/10/17 1548 05/24/17 1759   05/10/17 0830  vancomycin (VANCOCIN) 50 mg/mL oral solution 125 mg  Status:  Discontinued     125 mg Oral Every 6 hours 05/10/17 0753 05/10/17 1547   05/10/17 0800  ciprofloxacin (CIPRO) IVPB 400  mg  Status:  Discontinued     400 mg 200 mL/hr over 60 Minutes Intravenous Every 12 hours 05/09/17 2044 05/10/17 0753   05/10/17 0000  metroNIDAZOLE (FLAGYL) IVPB 500 mg     500 mg 100 mL/hr over 60 Minutes Intravenous Every 8 hours 05/09/17 2044     05/09/17 1615  ciprofloxacin (CIPRO) IVPB 400 mg     400 mg 200 mL/hr over 60 Minutes Intravenous  Once 05/09/17 1600 05/09/17  2100   05/09/17 1615  metroNIDAZOLE (FLAGYL) IVPB 500 mg     500 mg 100 mL/hr over 60 Minutes Intravenous  Once 05/09/17 1600 05/09/17 1756       Objective: Vitals:   05/14/17 0500 05/14/17 0700 05/14/17 0800 05/14/17 0826  BP: 138/83 (!) 166/82 (!) 148/86   Pulse:      Resp: 14 14 20    Temp:   98.4 F (36.9 C)   TempSrc:   Axillary   SpO2: 100% 100% 100% 97%  Weight: 69.7 kg (153 lb 10.6 oz)     Height:        Intake/Output Summary (Last 24 hours) at 05/14/17 0843 Last data filed at 05/14/17 0600  Gross per 24 hour  Intake             3160 ml  Output             1225 ml  Net             1935 ml   Filed Weights   05/12/17 0500 05/13/17 0500 05/14/17 0500  Weight: 67.1 kg (147 lb 14.9 oz) 68.7 kg (151 lb 7.3 oz) 69.7 kg (153 lb 10.6 oz)    Examination:  General exam: Appears calm and comfortable  HEENT: OP moist and clear Respiratory system: Anterior auscultation, diffuse exp wheezing, Cardiovascular system: S1 & S2 heard, Irr Irr . No JVD, murmurs, rubs or gallops Gastrointestinal system: Abdomen is mild distended, mild diffuse tenderness to palpation  Central nervous system: Alert, oriented to person and place, follow commands  Extremities: No LE pedal edema. Skin: No rashes, lesions or ulcers  Data Reviewed: I have personally reviewed following labs and imaging studies  CBC:  Recent Labs Lab 05/09/17 1320 05/10/17 0531 05/10/17 1423 05/11/17 0332 05/12/17 0311 05/14/17 0312  WBC 10.1 18.8* 21.4* 19.9* 18.2* 13.5*  NEUTROABS 8.5* 15.4*  --   --   --  9.8*  HGB 11.0* 11.7* 12.0* 10.7* 10.9* 10.7*  HCT 32.1* 34.3* 35.2* 31.0* 31.2* 30.4*  MCV 90.7 89.1 89.6 89.9 88.9 86.1  PLT 170 198 190 174 169 174   Basic Metabolic Panel:  Recent Labs Lab 05/10/17 1423 05/11/17 0332 05/12/17 0311 05/12/17 1309 05/14/17 0312  NA 136 135 139 140 136  K 4.0 3.4* 3.5 3.6 3.1*  CL 108 113* 115* 113* 111  CO2 21* 18* 21* 21* 21*  GLUCOSE 143* 148* 135* 144*  130*  BUN 12 9 <5* <5* <5*  CREATININE 0.96 0.77 0.64 0.69 0.61  CALCIUM 7.8* 6.9* 7.5* 7.8* 7.7*  MG  --  1.6*  --  2.0  --    GFR: Estimated Creatinine Clearance: 66.5 mL/min (by C-G formula based on SCr of 0.61 mg/dL). Liver Function Tests:  Recent Labs Lab 05/09/17 1320 05/11/17 0057 05/12/17 1309  AST 17 27  --   ALT 12* 13*  --   ALKPHOS 59 35*  --   BILITOT 0.5 0.2*  --   PROT 6.3* 4.6*  --  ALBUMIN 3.7 2.6* 2.8*    Recent Labs Lab 05/09/17 1320  LIPASE 27    Recent Labs Lab 05/10/17 1605  AMMONIA 15   Coagulation Profile: No results for input(s): INR, PROTIME in the last 168 hours. Cardiac Enzymes:  Recent Labs Lab 05/09/17 1321 05/10/17 1423 05/10/17 1927 05/11/17 0057  TROPONINI <0.03 <0.03 <0.03 <0.03   BNP (last 3 results) No results for input(s): PROBNP in the last 8760 hours. HbA1C: No results for input(s): HGBA1C in the last 72 hours. CBG:  Recent Labs Lab 05/10/17 1340 05/11/17 0740 05/12/17 0724 05/13/17 0845 05/14/17 0743  GLUCAP 144* 155* 127* 115* 117*   Lipid Profile: No results for input(s): CHOL, HDL, LDLCALC, TRIG, CHOLHDL, LDLDIRECT in the last 72 hours. Thyroid Function Tests: No results for input(s): TSH, T4TOTAL, FREET4, T3FREE, THYROIDAB in the last 72 hours. Anemia Panel: No results for input(s): VITAMINB12, FOLATE, FERRITIN, TIBC, IRON, RETICCTPCT in the last 72 hours. Sepsis Labs:  Recent Labs Lab 05/10/17 1605 05/10/17 1927 05/11/17 0057 05/11/17 1141  LATICACIDVEN 2.6* 3.3* 2.6* 1.1    Recent Results (from the past 240 hour(s))  Urine culture     Status: None   Collection Time: 05/09/17  1:22 PM  Result Value Ref Range Status   Specimen Description URINE, CATHETERIZED  Final   Special Requests Normal  Final   Culture   Final    NO GROWTH Performed at [facility], [address], [place], [place] [address]    Report Status 05/11/2017 FINAL  Final  Blood culture (routine x 2)     Status:  None (Preliminary result)   Collection Time: 05/09/17  1:40 PM  Result Value Ref Range Status   Specimen Description BLOOD RIGHT ARM  Final   Special Requests   Final    BOTTLES DRAWN AEROBIC AND ANAEROBIC Blood Culture adequate volume   Culture   Final    NO GROWTH 4 DAYS Performed at [facility], [address], [place], [place] [address]    Report Status PENDING  Incomplete  C difficile quick scan w PCR reflex     Status: Abnormal   Collection Time: 05/09/17  3:00 PM  Result Value Ref Range Status   C Diff antigen POSITIVE (A) NEGATIVE Final   C Diff toxin POSITIVE (A) NEGATIVE Final   C Diff interpretation Toxin producing C. difficile detected.  Final    Comment: CRITICAL RESULT CALLED TO, READ BACK BY AND VERIFIED WITH: D. [id number] 07.22.2018 N. MORRIS   Gastrointestinal Panel by PCR , Stool     Status: None   Collection Time: 05/09/17  4:34 PM  Result Value Ref Range Status   Campylobacter species NOT DETECTED NOT DETECTED Final   Plesimonas shigelloides NOT DETECTED NOT DETECTED Final   Salmonella species NOT DETECTED NOT DETECTED Final   Yersinia enterocolitica NOT DETECTED NOT DETECTED Final   Vibrio species NOT DETECTED NOT DETECTED Final   Vibrio cholerae NOT DETECTED NOT DETECTED Final   Enteroaggregative E coli (EAEC) NOT DETECTED NOT DETECTED Final   Enteropathogenic E coli (EPEC) NOT DETECTED NOT DETECTED Final   Enterotoxigenic E coli (ETEC) NOT DETECTED NOT DETECTED Final   Shiga like toxin producing E coli (STEC) NOT DETECTED NOT DETECTED Final   Shigella/Enteroinvasive E coli (EIEC) NOT DETECTED NOT DETECTED Final   Cryptosporidium NOT DETECTED NOT DETECTED Final   Cyclospora cayetanensis NOT DETECTED NOT DETECTED Final   Entamoeba histolytica NOT DETECTED NOT DETECTED Final  Giardia lamblia NOT DETECTED NOT DETECTED Final   Adenovirus F40/41 NOT DETECTED NOT DETECTED Final   Astrovirus NOT DETECTED NOT DETECTED Final   Norovirus GI/GII NOT  DETECTED NOT DETECTED Final   Rotavirus A NOT DETECTED NOT DETECTED Final   Sapovirus (I, II, IV, and V) NOT DETECTED NOT DETECTED Final  MRSA PCR Screening     Status: None   Collection Time: 05/10/17  3:29 PM  Result Value Ref Range Status   MRSA by PCR NEGATIVE NEGATIVE Final    Comment:        The GeneXpert MRSA Assay (FDA approved for NASAL specimens only), is one component of a comprehensive MRSA colonization surveillance program. It is not intended to diagnose MRSA infection nor to guide or monitor treatment for MRSA infections.      Radiology Studies: No results found.   Scheduled Meds: . budesonide (PULMICORT) nebulizer solution  0.25 mg Nebulization BID  . calcium carbonate  1 tablet Oral BID WC  . citalopram  20 mg Oral Daily  . feeding supplement (ENSURE ENLIVE)  237 mL Oral BID BM  . finasteride  5 mg Oral Daily  . gabapentin  300 mg Oral QHS  . ipratropium-albuterol  3 mL Nebulization Q12H  . loratadine  10 mg Oral Daily  . multivitamin with minerals  1 tablet Oral Daily  . potassium chloride  40 mEq Oral BID  . sodium chloride flush  3 mL Intravenous Q12H  . tamsulosin  0.4 mg Oral QPC breakfast  . vancomycin  500 mg Oral Q6H   Continuous Infusions: . dextrose 5 % and 0.9% NaCl 50 mL/hr at 05/14/17 0737  . metronidazole Stopped (05/14/17 0838)     LOS: 5 days    Time spent: Total of 25 minutes spent with pt, greater than 50% of which was spent in discussion of  treatment, counseling and coordination of care   Latrelle Dodrill, MD Pager: Text Page via [email or web address]  [phone]  If 7PM-7AM, please contact night-coverage [email or web address] Password [email or web address] 05/14/2017, 8:43 AM

## 2017-05-14 NOTE — Progress Notes (Signed)
PHARMACIST - PHYSICIAN COMMUNICATION DR:   David [name] CONCERNING: Antibiotic IV to Oral Route Change Policy  RECOMMENDATION: This patient is receiving metronidazole by the intravenous route.  Based on criteria approved by the Pharmacy and Therapeutics Committee, the antibiotic(s) is/are being converted to the equivalent oral dose form(s).   DESCRIPTION: These criteria include:  Patient being treated for a respiratory tract infection, urinary tract infection, cellulitis or clostridium difficile associated diarrhea if on metronidazole  The patient is not neutropenic and does not exhibit a GI malabsorption state  The patient is eating (either orally or via tube) and/or has been taking other orally administered medications for a least 24 hours  The patient is improving clinically and has a Tmax < 100.5  If you have questions about this conversion, please contact the Pharmacy Department  [phone]( [phone])  [name]   [name] PharmD, [place] Pager [phone] 05/14/2017 8:05 AM

## 2017-05-14 NOTE — Progress Notes (Signed)
Date:  May 14, 2017 Chart reviewed for concurrent status and case management needs. Will continue to follow patient progress. Having periods of a.fib requiring iv meds Discharge Planning: following for needs Expected discharge date: [id number] Marcelle SmilingRhonda Davis, BSN, PisinemoRN3, [place]   [phone]

## 2017-05-14 NOTE — Progress Notes (Signed)
Physical Therapy Treatment Patient Details Name: David SansRobert E [name] MRN: [id number] DOB: 17-Apr-1935 Today's Date: 05/14/2017    History of Present Illness David SansRobert E Beretta is a 82 y.o. male with medical history significant for dementia, chronic dysarthria, COPD, and depression, now presenting to the emergency department for evaluation of diarrhea with loss of appetite and malaise. positive for C Diff., recent treatment for suspected UTI.    PT Comments    The [patient was assisted into recliner, decreased ability to take steps to  Ambulate, shuffles feet.  Requires 2 max assist for mobility.  Follow Up Recommendations  SNF;Supervision/Assistance - 24 hourFamily wants to take patient home , patient needs to be able to ambualte     Equipment Recommendations  None recommended by PT    Recommendations for Other Services       Precautions / Restrictions Precautions Precautions: Fall Precaution Comments: c diff. flexiseal    Mobility  Bed Mobility   Bed Mobility: Supine to Sit     Supine to sit: Max assist;+2 for physical assistance Sit to supine: Total assist;+2 for physical assistance;+2 for safety/equipment   General bed mobility comments: assist for legs and trunk.  Pt with posterior lean, use of bed pad  Transfers Overall transfer level: Needs assistance Equipment used: Rolling walker (2 wheeled) Transfers: Sit to/from U[employer] CorporationStand;Stand Pivot Transfers Sit to Stand: Max assist;+2 physical assistance Stand pivot transfers: Min assist;+2 physical assistance       General transfer comment: assist to rise and stabilize.  pt with posterior lean when standing initially.  multimodal cues for hand placement; pt does not like to release hand on RW to reach for chair arms  Ambulation/Gait                 Stairs            Wheelchair Mobility    Modified Rankin (Stroke Patients Only)       Balance                                            Cognition Arousal/Alertness: Awake/alert Behavior During Therapy: WFL for tasks assessed/performed Overall Cognitive Status: History of cognitive impairments - at baseline                                 General Comments: patient was cooperative today      Exercises      General Comments        Pertinent Vitals/Pain Faces Pain Scale: No hurt    Home Living                      Prior Function            PT Goals (current goals can now be found in the care plan section) Progress towards PT goals: Progressing toward goals    Frequency    Min 3X/week      PT Plan Current plan remains appropriate    Co-evaluation              AM-PAC PT "6 Clicks" Daily Activity  Outcome Measure  Difficulty turning over in bed (including adjusting bedclothes, sheets and blankets)?: Total Difficulty moving from lying on back to sitting on the side of the bed? : Total Difficulty sitting down on and  standing up from a chair with arms (e.g., wheelchair, bedside commode, etc,.)?: Total Help needed moving to and from a bed to chair (including a wheelchair)?: Total Help needed walking in hospital room?: Total Help needed climbing 3-5 steps with a railing? : Total 6 Click Score: 6    End of Session Equipment Utilized During Treatment: Gait belt;Oxygen Activity Tolerance: Patient tolerated treatment well Patient left: in chair;with call bell/phone within reach;with family/visitor present;with chair alarm set Nurse Communication: Mobility status PT Visit Diagnosis: Unsteadiness on feet (R26.81);Other abnormalities of gait and mobility (R26.89)     Time: [id number] PT Time Calculation (min) (ACUTE ONLY): 16 min  Charges:  $Therapeutic Activity: 8-22 mins                    G CodesBlanchard Kelch:       Hiliary Osorto PT [phone]    Rada HayHill, Leara Rawl Elizabeth 05/14/2017, 2:22 PM

## 2017-05-15 ENCOUNTER — Encounter (HOSPITAL_COMMUNITY): Payer: Self-pay | Admitting: Physician Assistant

## 2017-05-15 DIAGNOSIS — I471 Supraventricular tachycardia: Secondary | ICD-10-CM

## 2017-05-15 DIAGNOSIS — I4719 Other supraventricular tachycardia: Secondary | ICD-10-CM

## 2017-05-15 DIAGNOSIS — I480 Paroxysmal atrial fibrillation: Secondary | ICD-10-CM

## 2017-05-15 DIAGNOSIS — E872 Acidosis: Secondary | ICD-10-CM

## 2017-05-15 LAB — CBC WITH DIFFERENTIAL/PLATELET
BASOS PCT: 1 %
Basophils Absolute: 0.1 10*3/uL (ref 0.0–0.1)
EOS ABS: 0.2 10*3/uL (ref 0.0–0.7)
Eosinophils Relative: 1 %
HCT: 32 % — ABNORMAL LOW (ref 39.0–52.0)
Hemoglobin: 11.1 g/dL — ABNORMAL LOW (ref 13.0–17.0)
Lymphocytes Relative: 20 %
Lymphs Abs: 3.3 10*3/uL (ref 0.7–4.0)
MCH: 30.6 pg (ref 26.0–34.0)
MCHC: 34.7 g/dL (ref 30.0–36.0)
MCV: 88.2 fL (ref 78.0–100.0)
MONO ABS: 2.3 10*3/uL — AB (ref 0.1–1.0)
MONOS PCT: 14 %
NEUTROS PCT: 65 %
Neutro Abs: 11.1 10*3/uL — ABNORMAL HIGH (ref 1.7–7.7)
Platelets: 310 10*3/uL (ref 150–400)
RBC: 3.63 MIL/uL — ABNORMAL LOW (ref 4.22–5.81)
RDW: 14.3 % (ref 11.5–15.5)
WBC: 17.1 10*3/uL — ABNORMAL HIGH (ref 4.0–10.5)

## 2017-05-15 LAB — BASIC METABOLIC PANEL
Anion gap: 6 (ref 5–15)
BUN: 6 mg/dL (ref 6–20)
CALCIUM: 8 mg/dL — AB (ref 8.9–10.3)
CO2: 24 mmol/L (ref 22–32)
CREATININE: 0.67 mg/dL (ref 0.61–1.24)
Chloride: 110 mmol/L (ref 101–111)
GLUCOSE: 120 mg/dL — AB (ref 65–99)
Potassium: 4.2 mmol/L (ref 3.5–5.1)
Sodium: 140 mmol/L (ref 135–145)

## 2017-05-15 LAB — GLUCOSE, CAPILLARY: GLUCOSE-CAPILLARY: 108 mg/dL — AB (ref 65–99)

## 2017-05-15 LAB — MAGNESIUM: MAGNESIUM: 1.8 mg/dL (ref 1.7–2.4)

## 2017-05-15 MED ORDER — CITALOPRAM HYDROBROMIDE 20 MG PO TABS
20.0000 mg | ORAL_TABLET | Freq: Every day | ORAL | Status: DC
  Administered 2017-05-15 – 2017-05-16 (×2): 20 mg via ORAL
  Filled 2017-05-15 (×2): qty 1

## 2017-05-15 MED ORDER — METOPROLOL TARTRATE 25 MG PO TABS
12.5000 mg | ORAL_TABLET | Freq: Two times a day (BID) | ORAL | Status: DC
  Administered 2017-05-15 – 2017-05-16 (×3): 12.5 mg via ORAL
  Filled 2017-05-15 (×3): qty 1

## 2017-05-15 NOTE — Care Management Important Message (Signed)
Important Message  Patient Details Important Message  Patient Details  Name: David SansRobert E Bonczek MRN: [id number] Date of Birth: 22-Aug-1935   Medicare Important Message Given:  Yes    Caren MacadamFuller, Farran Amsden 05/15/2017, 9:39 AM Name: David SansRobert E Sanchez MRN: [id number] Date of Birth: 22-Aug-1935   Medicare Important Message Given:  Yes    Caren MacadamFuller, Harm Jou 05/15/2017, 9:38 AMImportant Message  Patient Details  Name: David SansRobert E Hase MRN: [id number] Date of Birth: 22-Aug-1935   Medicare Important Message Given:  Yes    Caren MacadamFuller, Eleina Jergens 05/15/2017, 9:38 AM

## 2017-05-15 NOTE — Progress Notes (Signed)
Physical Therapy Treatment Patient Details Name: David [name] Foronda MRN: [id number] DOB: 1935-05-04 Today's Date: 05/15/2017    History of Present Illness David [name] [place] is a [age] y.o. male with medical history significant for dementia, chronic dysarthria, COPD, and depression, now presenting to the emergency department for evaluation of diarrhea with loss of appetite and malaise. positive for C Diff., recent treatment for suspected UTI.    PT Comments    The patient is slowly progressing in mobility and ambulation. HR 80-160 per RN while mobilizing,/ Granddaughter present and states that the family will be able to care for the patient now that he is ambulating  a little. The patient has hospital bed, WC , RW and BSC, HHPT and an aide. Continue PT.   Follow Up Recommendations  Home health PT;Supervision/Assistance - 24 hour     Equipment Recommendations  None recommended by PT    Recommendations for Other Services       Precautions / Restrictions Precautions Precautions: Fall Precaution Comments: c diff. flexiseal    Mobility  Bed Mobility Overal bed mobility: Needs Assistance Bed Mobility: Supine to Sit     Supine to sit: Mod assist     General bed mobility comments: assist for legs and trunk. improved  with patient self assisting legs and trunk  Transfers Overall transfer level: Needs assistance Equipment used: Rolling walker (2 wheeled) Transfers: Sit to/from Stand Sit to Stand: Mod assist Stand pivot transfers: Mod assist       General transfer comment: patient stood up from bed with  steady assist of one, pushed from bed. less posterior leaning.  Ambulation/Gait Ambulation/Gait assistance: Mod assist;+2 safety/equipment Ambulation Distance (Feet): 25 Feet Assistive device: Rolling walker (2 wheeled) Gait Pattern/deviations: Step-to pattern;Shuffle;Festinating     General Gait Details: very slow pace, small shuffling steps. RN reports HR 160's. Assist  with RW with forward progression.   Stairs            Wheelchair Mobility    Modified Rankin (Stroke Patients Only)       Balance   Sitting-balance support: No upper extremity supported;Feet supported Sitting balance-Leahy Scale: Fair Sitting balance - Comments: sitting upright. not leaning posteriorly today   Standing balance support: Bilateral upper extremity supported Standing balance-Leahy Scale: Poor                              Cognition Arousal/Alertness: Awake/alert Behavior During Therapy: WFL for tasks assessed/performed Overall Cognitive Status: History of cognitive impairments - at baseline                                 General Comments: patient was cooperative today      Exercises      General Comments        Pertinent Vitals/Pain Pain Assessment: Faces Faces Pain Scale: No hurt    Home Living                      Prior Function            PT Goals (current goals can now be found in the care plan section) Progress towards PT goals: Progressing toward goals    Frequency    Min 3X/week      PT Plan Current plan remains appropriate;Discharge plan needs to be updated    Co-evaluation  AM-PAC PT "6 Clicks" Daily Activity  Outcome Measure  Difficulty turning over in bed (including adjusting bedclothes, sheets and blankets)?: Total Difficulty moving from lying on back to sitting on the side of the bed? : Total Difficulty sitting down on and standing up from a chair with arms ([name].g., wheelchair, bedside commode, etc,.)?: Total Help needed moving to and from a bed to chair (including a wheelchair)?: Total Help needed walking in hospital room?: Total Help needed climbing 3-5 steps with a railing? : Total 6 Click Score: 6    End of Session Equipment Utilized During Treatment: Gait belt Activity Tolerance: Patient tolerated treatment well Patient left: with call bell/phone within  reach;with chair alarm set;with family/visitor present Nurse Communication: Mobility status PT Visit Diagnosis: Unsteadiness on feet (R26.81);Other abnormalities of gait and mobility (R26.89)     Time: 1020-1049 PT Time Calculation (min) (ACUTE ONLY): 29 min  Charges:                       G CodesBlanchard Kelch:       Jeremi Losito PT [phone]    Rada HayHill, Cesareo Vickrey Elizabeth 05/15/2017, 1:09 PM

## 2017-05-15 NOTE — Progress Notes (Signed)
Patient only voided 200cc from 7a-5p. MD made aware. Orders received to bladder scan. Bladder scan showed 560cc in bladder. Orders received to I&O cath patient and to re-check bladder scan at 9pm and if it is greater than 400cc then place a foley catheter. Prior to I&O cath, patient had voided another 200cc of amber urine. I&O cath done and resulted in 300cc amber urine. Report given to night RN and she will monitor urine output and re-check bladder scan at 9pm per MD orders.

## 2017-05-15 NOTE — Progress Notes (Signed)
  Speech Language Pathology Treatment: Dysphagia  Patient Details Name: David [name] MRN: [id number] DOB: 03-04-1935 Today's Date: 05/15/2017 Time: [id number] SLP Time Calculation (min) (ACUTE ONLY): 11 min  Assessment / Plan / Recommendation Clinical Impression  Pt asleep in bed - RN and SLP woke pt to provide him po medication.    Pt was weak with slow drawing on straw and delayed swallow repsonse.  NO indication of aspiration or airway compromise.  Oral cavity with white tinged secretions post-swallowing - and water provided to him to help clear.  Again swallow was audible and delayed and pt presents with frequent belching.  Continue diet with strict precautions.     Pt is more sleepy today with WBC increasing thus SLP will follow up Monday to assure tolerance/modify diet as needed.  Educated pt to findings/recommendations.  Concern for lack pf substantial intake present.    HPI HPI: 82 y.o.malewith medical history significant for dementia, chronic dysarthria, COPD, and depression, now presenting to the emergency department for evaluation of diarrhea with loss of appetite and malaise. Dx colitis, acute hypoxic respiratory failure with concerns for aspiration, hyponatremia, hypokalemia.  He recently lost his wife, who passed a month ago.       SLP Plan  Continue with current plan of care       Recommendations  Diet recommendations: Thin liquid;Dysphagia 3 (mechanical soft) Liquids provided via: Straw Medication Administration: Whole meds with liquid (with ENSURE, prefer thicker liquids) Supervision: Intermittent supervision to cue for compensatory strategies;Patient able to self feed Compensations: Slow rate;Small sips/bites;Minimize environmental distractions Postural Changes and/or Swallow Maneuvers: Seated upright 90 degrees;Upright 30-60 min after meal                Oral Care Recommendations: Oral care BID Follow up Recommendations:  (TBD) SLP Visit Diagnosis:  Dysphagia, oropharyngeal phase (R13.12) Plan: Continue with current plan of care       GO              David Burnetamara Hartley Urton, MS [facility] SLP [phone]   David [name], David [name] 05/15/2017, 4:09 PM

## 2017-05-15 NOTE — Progress Notes (Signed)
Occupational Therapy Treatment Patient Details Name: David [name] MRN: [id number] DOB: 1935/04/01 Today's Date: 05/15/2017    History of present illness David [name] is a 82 y.o. male with medical history significant for dementia, chronic dysarthria, COPD, and depression, now presenting to the emergency department for evaluation of diarrhea with loss of appetite and malaise. positive for C Diff., recent treatment for suspected UTI.   OT comments  Assisted pt back to bed.  Leaking flexiseal.  Pt with short shuffling steps and posterior LOB  Follow Up Recommendations  Supervision/Assistance - 24 hour;SNF;Home health OT    Equipment Recommendations  3 in 1 bedside commode --pt has a toilet riser but he may not consistently be able to walk to bathroom   Recommendations for Other Services      Precautions / Restrictions Precautions Precautions: Fall Precaution Comments: c diff. flexiseal Restrictions Weight Bearing Restrictions: No       Mobility Bed Mobility Overal bed mobility: Needs Assistance Bed Mobility: Supine to Sit     Supine to sit: Mod assist Sit to supine: Max assist   General bed mobility comments: assist for trunk and bil UEs  Transfers Overall transfer level: Needs assistance Equipment used: Rolling walker (2 wheeled) Transfers: Sit to/from Stand Sit to Stand: Mod assist Stand pivot transfers: Mod assist       General transfer comment: mod A for sit to stand and to stabilize. Pt tends to lean posteriorly    Balance   Sitting-balance support: No upper extremity supported;Feet supported Sitting balance-Leahy Scale: Fair Sitting balance - Comments: sitting upright. not leaning posteriorly today   Standing balance support: Bilateral upper extremity supported Standing balance-Leahy Scale: Poor                             ADL either performed or assessed with clinical judgement   ADL                            Transfer: Moderate assistance;Stand-pivot;RW (to bed)              General ADL Comments: multimodal cues and assistance to move walker for back to bed. Pt had leaking from flexiseal.  Assisted NT with cleaning him up and changing chux     Vision       Perception     Praxis      Cognition Arousal/Alertness: Awake/alert Behavior During Therapy: WFL for tasks assessed/performed Overall Cognitive Status: History of cognitive impairments - at baseline                                 General Comments: patient was cooperative today        Exercises     Shoulder Instructions       General Comments      Pertinent Vitals/ Pain       Pain Assessment: Faces Faces Pain Scale: Hurts little more Pain Location: buttocks Pain Descriptors / Indicators: Grimacing Pain Intervention(s): Limited activity within patient's tolerance;Monitored during session;Repositioned  Home Living                                          Prior Functioning/Environment  Frequency  Min 2X/week        Progress Toward Goals  OT Goals(current goals can now be found in the care plan section)  Progress towards OT goals: Progressing toward goals  Acute Rehab OT Goals Time For Goal Achievement: 05/27/17  Plan      Co-evaluation                 AM-PAC PT "6 Clicks" Daily Activity     Outcome Measure   Help from another person eating meals?: A Lot Help from another person taking care of personal grooming?: A Lot Help from another person toileting, which includes using toliet, bedpan, or urinal?: Total Help from another person bathing (including washing, rinsing, drying)?: A Lot Help from another person to put on and taking off regular upper body clothing?: A Lot Help from another person to put on and taking off regular lower body clothing?: Total 6 Click Score: 10    End of Session    OT Visit Diagnosis: Unsteadiness on feet  (R26.81);Muscle weakness (generalized) (M62.81)   Activity Tolerance Patient tolerated treatment well   Patient Left in bed;with call bell/phone within reach;with bed alarm set   Nurse Communication  (RN/Tech re flexiseal)        Time: 1610-9604: 1303-1329 OT Time Calculation (min): 26 min  Charges: OT General Charges $OT Visit: 1 Procedure OT Treatments $Therapeutic Activity: 23-37 mins  David [name], OTR/L [phone] 05/15/2017   River Mckercher 05/15/2017, 1:41 PM

## 2017-05-15 NOTE — Consult Note (Signed)
Cardiology Consult    Patient ID: GEROME KOKESH MRN: [id number], DOB/AGE: December 27, 1934   Admit date: 05/09/2017 Date of Consult: 05/15/2017  Primary Physician: Patient, No Pcp Per Primary Cardiologist: new - Dr.  Salvia Requesting Provider: Dr. Edward Jolly  Reason for Consult:   Patient Profile    Mr. Klinge has a PMH significant for TIA, COPD, dementia, dysarthria, depression, and history of aspiration. He presented to [facility] with generalized weakness, confusion, and diarreha. He was admitted and found to have Cdiff and was admitted for ABX administration. He was subsequently found to be in Afib vs MAT.  BRYSYN BRANDENBERGER is a 82 y.o. male who is being seen today for the evaluation of Afib at the request of Dr. Edward Jolly.   Past Medical History   Past Medical History:  Diagnosis Date  . Aspiration into airway   . COPD (chronic obstructive pulmonary disease) (HCC)   . Pneumonia   . Stroke [facility])    TIA's per CT scan    Past Surgical History:  Procedure Laterality Date  . JOINT REPLACEMENT Bilateral    Hip replacemen     Allergies  No Known Allergies  History of Present Illness    Mr. Skeet was recently seen in ED for weakness and diarrhea. He was found to have a UTI and was discharged on PO keflex (04/29/17). He returned to ED on 05/09/17 with worsening weakness, confusion, and diarrhea and was found to have Cdiff. He was admitted for ABX administration. On admission, he also reported a near-syncopal episode during a large volume BM and in the setting of likely dehydration. He became diaphoretic with incoherent mumbling (per EDP).   On 05/13/17, EKG shows conversion to Afib with CVR in the 100s vs possible MAT. He converted to NSR and is currently in NSR on telemetry. He has had some infrequent pauses less than 1.3 sec on telemetry. He has a CHA2DS2-VASc Score of at least 4 and denies recent falls. He denies feeling palpitations, dizziness, lightheadedness, feelings of  pre-/syncope, and chest pain. He is actively wheezing on exam. He is asymptomatic during these tachycardic (less than 120) episodes.  Inpatient Medications    . budesonide (PULMICORT) nebulizer solution  0.25 mg Nebulization BID  . calcium carbonate  1 tablet Oral BID WC  . citalopram  20 mg Oral Daily  . feeding supplement (ENSURE ENLIVE)  237 mL Oral BID BM  . finasteride  5 mg Oral Daily  . gabapentin  300 mg Oral QHS  . ipratropium-albuterol  3 mL Nebulization Q12H  . loratadine  10 mg Oral Daily  . metroNIDAZOLE  500 mg Oral Q8H  . multivitamin with minerals  1 tablet Oral Daily  . potassium chloride  40 mEq Oral BID  . sodium chloride flush  3 mL Intravenous Q12H  . tamsulosin  0.4 mg Oral QPC breakfast  . vancomycin  500 mg Oral Q6H     Outpatient Medications    Prior to Admission medications   Medication Sig Start Date End Date Taking? Authorizing Provider  Cholecalciferol (VITAMIN D3) 1000 units CAPS Take 1,000 Units by mouth daily.   Yes [provider]  citalopram (CELEXA) 20 MG tablet Take 20 mg by mouth daily.   Yes [provider]  finasteride (PROSCAR) 5 MG tablet Take 5 mg by mouth daily.   Yes [provider]  gabapentin (NEURONTIN) 300 MG capsule Take 300 mg by mouth at bedtime.    Yes [provider]  ibuprofen (ADVIL,MOTRIN)  200 MG tablet Take 400 mg by mouth every 6 (six) hours as needed for headache, mild pain or moderate pain.   Yes [provider]  Lactobacillus (PROBIATA PO) Take 1 capsule by mouth daily.    Yes [provider]  loratadine (CLARITIN) 10 MG tablet Take 10 mg by mouth daily.   Yes [provider]  Melatonin 3 MG TABS Take 6 mg by mouth at bedtime.   Yes [provider]  Multiple Vitamin (MULTIVITAMIN) capsule Take 1 capsule by mouth daily.   Yes [provider]  tamsulosin (FLOMAX) 0.4 MG CAPS capsule Take 0.4 mg by mouth daily after supper.    Yes [provider]  cephALEXin (KEFLEX) 500 MG capsule Take 1 capsule (500 mg total) by mouth 2 (two) times daily. Patient not taking: Reported on 05/09/2017 04/29/17   Roxy Horseman, PA-C     Family History    Family History  Problem Relation Age of Onset  . Hypertension Father     Social History    Social History   Social History  . Marital status: Widowed    Spouse name: N/A  . Number of children: N/A  . Years of education: N/A   Occupational History  . Not on file.   Social History Main Topics  . Smoking status: Former Games developer  . Smokeless tobacco: Never Used  . Alcohol use No  . Drug use: No  . Sexual activity: Not on file   Other Topics Concern  . Not on file   Social History Narrative  . No narrative on file     Review of Systems    General:  No chills, fever, night sweats or weight changes.  Cardiovascular:  No chest pain, dyspnea on exertion, edema, orthopnea, palpitations, paroxysmal nocturnal dyspnea. Dermatological: No rash, lesions/masses Respiratory: No cough, dyspnea Urologic: No hematuria, dysuria Abdominal:   No nausea, vomiting, diarrhea, bright red blood per rectum, melena, or hematemesis Neurologic:  No visual changes, wkns, changes in mental status. All other systems reviewed and are otherwise negative except as noted above.  Physical Exam    Blood pressure (!) 117/96, pulse (!) 118, temperature 98.5 F (36.9 C), temperature source Oral, resp. rate 18, height 5\' 6"  (1.676 m), weight 152 lb 1.9 oz (69 kg), SpO2 96 %.  General: Pleasant, NAD Psych: Normal affect. Neuro: Alert and oriented X 3. Moves all extremities spontaneously. HEENT: Normal  Neck: Supple without bruits or JVD. Lungs:  Respirations labored, audible wheezing following breathing treatment Heart: RRR no s3, s4, or murmurs. Abdomen: Soft, non-tender, non-distended, BS + x 4.  Extremities: No clubbing, cyanosis or edema. DP/PT/Radials 2+ and equal bilaterally.  Labs      Troponin (Point of Care Test) No results for input(s): TROPIPOC in the last 72 hours. No results for input(s): CKTOTAL, CKMB, TROPONINI in the last 72 hours. Lab Results  Component Value Date   WBC 13.5 (H) 05/14/2017   HGB 10.7 (L) 05/14/2017   HCT 30.4 (L) 05/14/2017   MCV 86.1 05/14/2017   PLT 174 05/14/2017    Recent Labs Lab 05/11/17 0057  05/14/17 0312  NA  --   < > 136  K  --   < > 3.1*  CL  --   < > 111  CO2  --   < > 21*  BUN  --   < > <5*  CREATININE  --   < > 0.61  CALCIUM  --   < >  7.7*  PROT 4.6*  --   --   BILITOT 0.2*  --   --   ALKPHOS 35*  --   --   ALT 13*  --   --   AST 27  --   --   GLUCOSE  --   < > 130*  < > = values in this interval not displayed. No results found for: CHOL, HDL, LDLCALC, TRIG No results found for: [facility]   Radiology Studies    Dg Chest 2 View  Result Date: 05/09/2017 CLINICAL DATA:  Sudden onset weakness. EXAM: CHEST  2 VIEW COMPARISON:  04/29/2017 FINDINGS: AP and lateral views of the chest show hyperexpansion with chronic interstitial coarsening. No focal airspace consolidation. No pulmonary edema or pleural effusion. The cardiopericardial silhouette is within normal limits for size. Lower thoracic compression fracture is similar to prior. Telemetry leads overlie the chest. IMPRESSION: Stable.  Emphysema without acute cardiopulmonary findings. Electronically Signed   By: Kennith CenterEric  Mansell M.D.   On: 05/09/2017 15:58   Dg Chest 2 View  Result Date: 04/29/2017 CLINICAL DATA:  82 year old male with weakness and confusion. EXAM: CHEST  2 VIEW COMPARISON:  None FINDINGS: Shallow inspiration with bibasilar atelectasis/scarring. No focal consolidation, pleural effusion, or pneumothorax. Left hilar calcified granuloma. The cardiac silhouette is within normal limits. No acute osseous pathology. There is interposition of the transverse colon under the right hemidiaphragm. IMPRESSION: No active cardiopulmonary disease. Electronically Signed   By:  Elgie CollardArash  Radparvar M.D.   On: 04/29/2017 04:33   Ct Head Wo Contrast  Result Date: 05/10/2017 CLINICAL DATA:  Advanced and dementia.  Colitis. EXAM: CT HEAD WITHOUT CONTRAST TECHNIQUE: Contiguous axial images were obtained from the base of the skull through the vertex without intravenous contrast. COMPARISON:  CT 04/29/2017 FINDINGS: Brain: Advanced generalized brain atrophy. Advanced chronic small-vessel ischemic changes of the cerebral hemispheric white matter. Old small vessel infarctions of the basal ganglia and thalami. No sign of acute infarction, mass lesion, hemorrhage, hydrocephalus or extra-axial collection. Vascular: There is atherosclerotic calcification of the major vessels at the base of the brain. Skull: Normal Sinuses/Orbits: Clear/normal Other: None significant IMPRESSION: No acute or reversible finding by CT. Advanced generalized atrophy with chronic small-vessel ischemic changes throughout the brain. Electronically Signed   By: Paulina FusiMark  Shogry M.D.   On: 05/10/2017 15:04   Ct Head Wo Contrast  Result Date: 04/29/2017 CLINICAL DATA:  82 year old male with weakness and dizziness. History of TIA. EXAM: CT HEAD WITHOUT CONTRAST TECHNIQUE: Contiguous axial images were obtained from the base of the skull through the vertex without intravenous contrast. COMPARISON:  None. FINDINGS: Brain: There is advanced age-related atrophy and chronic microvascular ischemic changes. No acute intracranial hemorrhage. No mass effect or midline shift. No extra-axial fluid collections. Vascular: No hyperdense vessel or unexpected calcification. Skull: Normal. Negative for fracture or focal lesion. Sinuses/Orbits: No acute finding. Other: None IMPRESSION: 1. No acute intracranial hemorrhage. 2. Age-related atrophy and chronic microvascular ischemic changes. If symptoms persist, and there are no contraindications, MRI may provide better evaluation if clinically indicated. Electronically Signed   By: Elgie CollardArash  Radparvar M.D.    On: 04/29/2017 04:45   Ct Abdomen Pelvis W Contrast  Result Date: 05/09/2017 CLINICAL DATA:  Lower abdominal pain and diarrhea today. Recent UTI. EXAM: CT ABDOMEN AND PELVIS WITH CONTRAST TECHNIQUE: Multidetector CT imaging of the abdomen and pelvis was performed using the standard protocol following bolus administration of intravenous contrast. CONTRAST:  100mL ISOVUE-300 IOPAMIDOL (ISOVUE-300) INJECTION 61% COMPARISON:  None. FINDINGS: Lower chest: Calcified granuloma over the left lower lobe as lung bases otherwise within normal. Small hiatal hernia. Hepatobiliary: Within normal. Pancreas: Oval cystic lesion over the pancreatic tail measuring 1.2 x 2.1 cm. Centered over the main pancreatic duct. Spleen: A few calcified granulomas present. Adrenals/Urinary Tract: Adrenal glands are normal. Kidneys are normal in size without hydronephrosis or nephrolithiasis. No focal renal mass. Visualized portions of the ureters are within normal. The distal ureters are difficult to visualize as is the entire bladder due to streak artifact from adjacent hardware over the hips. Stomach/Bowel: Small hiatal hernia. Stomach and small bowel are otherwise unremarkable. Appendix is within normal. There is mild diffuse edema/wall thickening involving the ascending colon as well as descending and rectosigmoid colon suggesting a colitis of infectious or inflammatory nature. Vascular/Lymphatic: Moderate calcified plaque over the abdominal aorta and iliac arteries. No evidence of adenopathy. Reproductive: Within normal. Other: No significant free fluid. Musculoskeletal: Right total hip arthroplasty. Hardware over the left femur intact. Moderate degenerate changes spine with multiple compression fractures involving T11 and T12 as well as L1, L3 and L5 likely chronic, but age indeterminate. Mild degenerate change of the left hip. IMPRESSION: Mild wall thickening/ submucosal edema involving the ascending and descending/ rectosigmoid colon  likely an acute colitis due to infectious or inflammatory nature. 2 cm cystic lesion over the tail of the pancreas centered over the main pancreatic duct. Recommend follow up pre and post contrast MRI/MRCP or pancreatic protocol CT in 2 years. This recommendation follows ACR consensus guidelines: Management of Incidental Pancreatic Cysts: A White Paper of the ACR Incidental Findings Committee. J Am Coll Radiol 2017;14:911-923. Multiple spinal compression fractures as described likely chronic, although age indeterminate. Aortic Atherosclerosis (ICD10-I70.0). Electronically Signed   By: Elberta Fortis M.D.   On: 05/09/2017 15:50   Dg Chest Port 1 View  Result Date: 05/14/2017 CLINICAL DATA:  82 year old male with shortness of breath. EXAM: PORTABLE CHEST 1 VIEW COMPARISON:  Chest x-ray 05/10/2017. FINDINGS: Lung volumes are low. Bibasilar opacities may reflect areas of atelectasis and/or airspace consolidation. Small left pleural effusion. No evidence of pulmonary edema. Mild interstitial prominence and peribronchial cuffing, similar to prior examinations, likely chronic. Heart size is normal. The patient is rotated to the left on today's exam, resulting in distortion of the mediastinal contours and reduced diagnostic sensitivity and specificity for mediastinal pathology. Atherosclerosis in the thoracic aorta. Numerous calcified mediastinal and bilateral hilar lymph nodes. Interposition of the colon beneath the right hemidiaphragm again noted. IMPRESSION: 1. Low lung volumes with bibasilar areas of atelectasis and/or consolidation and small left pleural effusion. 2. Aortic atherosclerosis. Electronically Signed   By: Trudie Reed M.D.   On: 05/14/2017 09:22   Dg Chest Port 1 View  Result Date: 05/10/2017 CLINICAL DATA:  Increased dyspnea and weakness this morning EXAM: PORTABLE CHEST 1 VIEW COMPARISON:  Portable exam 1113 hours compared to 05/09/2017 FINDINGS: Normal heart size, mediastinal contours, and  pulmonary vascularity. Atherosclerotic calcifications aorta. Emphysematous changes without infiltrate, pleural effusion or pneumothorax. Calcified adenopathy at LEFT hilum. Bones demineralized. IMPRESSION: Emphysematous and old granulomatous disease changes. No acute abnormalities. Aortic Atherosclerosis (ICD10-I70.0) and Emphysema (ICD10-J43.9). Electronically Signed   By: Ulyses Southward M.D.   On: 05/10/2017 11:26    ECG & Cardiac Imaging    EKG 05/14/17: Afib with ventricular rate 104 vs MAT  Assessment & Plan    1. Possible new onset Afib vs multifocal atrial tachycardia - in review of EKG and telemetry, question Afib vs  MAT - if this is new onset Afib is in the setting of infection, it will likely resolve as his Cdiff resolves - he is asymptomatic with these episodes - This patients CHA2DS2-VASc Score and unadjusted Ischemic Stroke Rate (% per year) is equal to 4.8 % stroke rate/year from a score of 4 (age, TIA).  - will consult EP for Afib vs MAT and [facility] recommendation   2. Cdiff - pt on ABX treatment    Signed, Marcelino Dusterngela Nicole Triston Lisanti, PA-C 05/15/2017, 9:09 AM [phone]

## 2017-05-15 NOTE — Progress Notes (Signed)
Spoke with pt's granddaughter Dorathy DaftKayla who states, [facility] Will continue at discharge. Will need HHRN/NA orders and face to face.

## 2017-05-15 NOTE — Progress Notes (Signed)
PROGRESS NOTE [facility]   Armida SansRobert E Underberg   ZOX:[id number] DOB: Aug 21, 1935  DOA: 05/09/2017 PCP: David [name], No Pcp Per   Brief Narrative:  82 year old male with medical history significant for dementia, chronic dysarthria, COPD and depression presented to the emergency department complaining of diarrhea. David [name] was found to have C. difficile colitis and admitted for antibiotic treatment. Hospital course has been complicated with delirium, neurological evaluation was done which was negative.  Subjective: David [name] seen and examined at bedside. Report feeling okay, still with much appetite. Diarrhea has significantly improved. WBC slightly elevated today.  Assessment & Plan: C diff Colitis - improving Pt presented with diarrhea, malaise, brief episode of chills, loss of appetite  Was on Keflex recently for suspected UTI  CT findings suggest infectious colitis; no fever or leukocytosis, normal lactate  Initially treated with Cipro and Flagyl. Blood culture and urine culture no growth, C diff assay positive, and GI pathogen panel; negative Started oral vancomycin. Cipro d/ced.  Continue with oral vancomycin 500 Q 6 hours. Continue with flagyl can switch to oral  WBC slight elevated unsure why  Sepsis 2/2 to C diff colitis  Sepsis physiology has resolved  See above   Afib vs MAT  CHADVASCS 3 cardiology favor a/c Started on metoprolol 12.5 mg  Continue tele Will discuss benefits and risk of a/c with family in AM   Metabolic acidosis - resolved  secondary to GI diagnosis has improved with IV fluids .   Hypomagnesemia - resolved   Acute hypoxic respiratory failure; - resolved off o2  Continue nebulizer PRN  CXR with ? Consolidation vs atelectasis may caused the increase in leukocytosis  Swallow evaluation recommending dysphagia 3 diet   Monitor   Delirium due to infectious process - Resolved  Neurology was consulted which getting with delirium diagnosis Family  reporting the David [name] is having tremors. - non visualized by me, could be myoclonus in setting of severe illness ABG. Was negative for hypercapnia.  CT head negative for stroke. EEG;is abnormal due to moderatediffuse slowing of the background. EKG and troponin were negative .   Hyponatremia - resolved Treated with IV fluids  Hypokalemia due to GI losses Replete Check Mag  Depression - stable  Continue Celexa   Anemia, occult blood in stool, Hgb remains stable  Hgb is 11.0 on admission No gross blood in stool, but FOBT+  Likely secondary to acute colitis   Incidental pancreatic lesion  Noted on CT abd/pelvis  Follow-up imaging in 2 years recommended as appropriate   DVT prophylaxis: SCDs Code Status: Full code Family Communication: None at bedside  Disposition Plan: Possible d/c in next 24 hrs if remains stable   Consultants:   None  Procedures:   EEG 05/11/17  Antimicrobials: Anti-infectives    Start     Dose/Rate Route Frequency Ordered Stop   05/14/17 1400  metroNIDAZOLE (FLAGYL) tablet 500 mg     500 mg Oral Every 8 hours 05/14/17 1018     05/10/17 1800  vancomycin (VANCOCIN) 50 mg/mL oral solution 500 mg  Status:  Discontinued     500 mg Oral Every 6 hours 05/10/17 1548 05/10/17 1548   05/10/17 1600  vancomycin (VANCOCIN) 50 mg/mL oral solution 500 mg     500 mg Oral Every 6 hours 05/10/17 1548 05/24/17 1759   05/10/17 0830  vancomycin (VANCOCIN) 50 mg/mL oral solution 125 mg  Status:  Discontinued     125 mg Oral Every 6 hours 05/10/17 0753 05/10/17 1547  05/10/17 0800  ciprofloxacin (CIPRO) IVPB 400 mg  Status:  Discontinued     400 mg 200 mL/hr over 60 Minutes Intravenous Every 12 hours 05/09/17 2044 05/10/17 0753   05/10/17 0000  metroNIDAZOLE (FLAGYL) IVPB 500 mg  Status:  Discontinued     500 mg 100 mL/hr over 60 Minutes Intravenous Every 8 hours 05/09/17 2044 05/14/17 1018   05/09/17 1615  ciprofloxacin (CIPRO) IVPB 400 mg     400 mg 200  mL/hr over 60 Minutes Intravenous  Once 05/09/17 1600 05/09/17 2100   05/09/17 1615  metroNIDAZOLE (FLAGYL) IVPB 500 mg     500 mg 100 mL/hr over 60 Minutes Intravenous  Once 05/09/17 1600 05/09/17 1756       Objective: Vitals:   05/14/17 2300 05/15/17 0600 05/15/17 0849 05/15/17 1331  BP: (!) 146/84 (!) 117/96  121/66  Pulse:    83  Resp:  18  15  Temp:  98.5 F (36.9 C)  98.6 F (37 C)  TempSrc:  Oral  Oral  SpO2:  98% 96% 98%  Weight:  69 kg (152 lb 1.9 oz)    Height:        Intake/Output Summary (Last 24 hours) at 05/15/17 1345 Last data filed at 05/15/17 0500  Gross per 24 hour  Intake              970 ml  Output             1100 ml  Net             -130 ml   Filed Weights   05/13/17 0500 05/14/17 0500 05/15/17 0600  Weight: 68.7 kg (151 lb 7.3 oz) 69.7 kg (153 lb 10.6 oz) 69 kg (152 lb 1.9 oz)    Examination:  General: NAD Cardiovascular: RRR, S1/S2 +, no rubs, no gallops Respiratory: CTA bilaterally, no wheezing, no rhonchi Abdominal: Soft, NT, ND, bowel sounds + Extremities: no edema.   Data Reviewed: I have personally reviewed following labs and imaging studies  CBC:  Recent Labs Lab 05/09/17 1320 05/10/17 0531 05/10/17 1423 05/11/17 0332 05/12/17 0311 05/14/17 0312 05/15/17 0920  WBC 10.1 18.8* 21.4* 19.9* 18.2* 13.5* 17.1*  NEUTROABS 8.5* 15.4*  --   --   --  9.8* 11.1*  HGB 11.0* 11.7* 12.0* 10.7* 10.9* 10.7* 11.1*  HCT 32.1* 34.3* 35.2* 31.0* 31.2* 30.4* 32.0*  MCV 90.7 89.1 89.6 89.9 88.9 86.1 88.2  PLT 170 198 190 174 169 174 310   Basic Metabolic Panel:  Recent Labs Lab 05/11/17 0332 05/12/17 0311 05/12/17 1309 05/14/17 0312 05/15/17 0920  NA 135 139 140 136 140  K 3.4* 3.5 3.6 3.1* 4.2  CL 113* 115* 113* 111 110  CO2 18* 21* 21* 21* 24  GLUCOSE 148* 135* 144* 130* 120*  BUN 9 <5* <5* <5* 6  CREATININE 0.77 0.64 0.69 0.61 0.67  CALCIUM 6.9* 7.5* 7.8* 7.7* 8.0*  MG 1.6*  --  2.0 1.7 1.8   GFR: Estimated Creatinine  Clearance: 66.5 mL/min (by C-G formula based on SCr of 0.67 mg/dL). Liver Function Tests:  Recent Labs Lab 05/09/17 1320 05/11/17 0057 05/12/17 1309  AST 17 27  --   ALT 12* 13*  --   ALKPHOS 59 35*  --   BILITOT 0.5 0.2*  --   PROT 6.3* 4.6*  --   ALBUMIN 3.7 2.6* 2.8*    Recent Labs Lab 05/09/17 1320  LIPASE 27    Recent Labs Lab  05/10/17 1605  AMMONIA 15   Coagulation Profile: No results for input(s): INR, PROTIME in the last 168 hours. Cardiac Enzymes:  Recent Labs Lab 05/09/17 1321 05/10/17 1423 05/10/17 1927 05/11/17 0057  TROPONINI <0.03 <0.03 <0.03 <0.03   BNP (last 3 results) No results for input(s): PROBNP in the last 8760 hours. HbA1C: No results for input(s): HGBA1C in the last 72 hours. CBG:  Recent Labs Lab 05/11/17 0740 05/12/17 0724 05/13/17 0845 05/14/17 0743 05/15/17 0748  GLUCAP 155* 127* 115* 117* 108*   Lipid Profile: No results for input(s): CHOL, HDL, LDLCALC, TRIG, CHOLHDL, LDLDIRECT in the last 72 hours. Thyroid Function Tests: No results for input(s): TSH, T4TOTAL, FREET4, T3FREE, THYROIDAB in the last 72 hours. Anemia Panel: No results for input(s): VITAMINB12, FOLATE, FERRITIN, TIBC, IRON, RETICCTPCT in the last 72 hours. Sepsis Labs:  Recent Labs Lab 05/10/17 1605 05/10/17 1927 05/11/17 0057 05/11/17 1141  LATICACIDVEN 2.6* 3.3* 2.6* 1.1    Recent Results (from the past 240 hour(s))  Urine culture     Status: None   Collection Time: 05/09/17  1:22 PM  Result Value Ref Range Status   Specimen Description URINE, CATHETERIZED  Final   Special Requests Normal  Final   Culture   Final    NO GROWTH Performed at [facility], [address], [place], [place] [address]    Report Status 05/11/2017 FINAL  Final  Blood culture (routine x 2)     Status: None   Collection Time: 05/09/17  1:40 PM  Result Value Ref Range Status   Specimen Description BLOOD RIGHT ARM  Final   Special Requests   Final    BOTTLES  DRAWN AEROBIC AND ANAEROBIC Blood Culture adequate volume   Culture   Final    NO GROWTH 5 DAYS Performed at [facility], [address] [place], [place] [address]    Report Status 05/14/2017 FINAL  Final  C difficile quick scan w PCR reflex     Status: Abnormal   Collection Time: 05/09/17  3:00 PM  Result Value Ref Range Status   C Diff antigen POSITIVE (A) NEGATIVE Final   C Diff toxin POSITIVE (A) NEGATIVE Final   C Diff interpretation Toxin producing C. difficile detected.  Final    Comment: CRITICAL RESULT CALLED TO, READ BACK BY AND VERIFIED WITH: D. [email or web address] [id number] 07.22.2018 N. MORRIS   Gastrointestinal Panel by PCR , Stool     Status: None   Collection Time: 05/09/17  4:34 PM  Result Value Ref Range Status   Campylobacter species NOT DETECTED NOT DETECTED Final   Plesimonas shigelloides NOT DETECTED NOT DETECTED Final   Salmonella species NOT DETECTED NOT DETECTED Final   Yersinia enterocolitica NOT DETECTED NOT DETECTED Final   Vibrio species NOT DETECTED NOT DETECTED Final   Vibrio cholerae NOT DETECTED NOT DETECTED Final   Enteroaggregative E coli (EAEC) NOT DETECTED NOT DETECTED Final   Enteropathogenic E coli (EPEC) NOT DETECTED NOT DETECTED Final   Enterotoxigenic E coli (ETEC) NOT DETECTED NOT DETECTED Final   Shiga like toxin producing E coli (STEC) NOT DETECTED NOT DETECTED Final   Shigella/Enteroinvasive E coli (EIEC) NOT DETECTED NOT DETECTED Final   Cryptosporidium NOT DETECTED NOT DETECTED Final   Cyclospora cayetanensis NOT DETECTED NOT DETECTED Final   Entamoeba histolytica NOT DETECTED NOT DETECTED Final   Giardia lamblia NOT DETECTED NOT DETECTED Final   Adenovirus F40/41 NOT DETECTED NOT DETECTED Final   Astrovirus NOT DETECTED  NOT DETECTED Final   Norovirus GI/GII NOT DETECTED NOT DETECTED Final   Rotavirus A NOT DETECTED NOT DETECTED Final   Sapovirus (I, II, IV, and V) NOT DETECTED NOT DETECTED Final  MRSA PCR Screening     Status: None    Collection Time: 05/10/17  3:29 PM  Result Value Ref Range Status   MRSA by PCR NEGATIVE NEGATIVE Final    Comment:        The GeneXpert MRSA Assay (FDA approved for NASAL specimens only), is one component of a comprehensive MRSA colonization surveillance program. It is not intended to diagnose MRSA infection nor to guide or monitor treatment for MRSA infections.      Radiology Studies: Dg Chest Port 1 View  Result Date: 05/14/2017 CLINICAL DATA:  82 year old male with shortness of breath. EXAM: PORTABLE CHEST 1 VIEW COMPARISON:  Chest x-ray 05/10/2017. FINDINGS: Lung volumes are low. Bibasilar opacities may reflect areas of atelectasis and/or airspace consolidation. Small left pleural effusion. No evidence of pulmonary edema. Mild interstitial prominence and peribronchial cuffing, similar to prior examinations, likely chronic. Heart size is normal. The David [name] is rotated to the left on today's exam, resulting in distortion of the mediastinal contours and reduced diagnostic sensitivity and specificity for mediastinal pathology. Atherosclerosis in the thoracic aorta. Numerous calcified mediastinal and bilateral hilar lymph nodes. Interposition of the colon beneath the right hemidiaphragm again noted. IMPRESSION: 1. Low lung volumes with bibasilar areas of atelectasis and/or consolidation and small left pleural effusion. 2. Aortic atherosclerosis. Electronically Signed   By: Trudie Reedaniel  Entrikin M.D.   On: 05/14/2017 09:22     Scheduled Meds: . budesonide (PULMICORT) nebulizer solution  0.25 mg Nebulization BID  . calcium carbonate  1 tablet Oral BID WC  . citalopram  20 mg Oral Daily  . feeding supplement (ENSURE ENLIVE)  237 mL Oral BID BM  . finasteride  5 mg Oral Daily  . gabapentin  300 mg Oral QHS  . ipratropium-albuterol  3 mL Nebulization Q12H  . loratadine  10 mg Oral Daily  . metroNIDAZOLE  500 mg Oral Q8H  . multivitamin with minerals  1 tablet Oral Daily  . potassium chloride   40 mEq Oral BID  . sodium chloride flush  3 mL Intravenous Q12H  . tamsulosin  0.4 mg Oral QPC breakfast  . vancomycin  500 mg Oral Q6H   Continuous Infusions:    LOS: 6 days    Time spent: Total of 15 minutes spent with pt, greater than 50% of which was spent in discussion of  treatment, counseling and coordination of care   Latrelle DodrillEdwin Silva, MD Pager: Text Page via [email or web address]  [phone]  If 7PM-7AM, please contact night-coverage [email or web address] Password [email or web address] 05/15/2017, 1:45 PM

## 2017-05-16 LAB — BASIC METABOLIC PANEL
Anion gap: 3 — ABNORMAL LOW (ref 5–15)
BUN: 10 mg/dL (ref 6–20)
CO2: 25 mmol/L (ref 22–32)
CREATININE: 0.63 mg/dL (ref 0.61–1.24)
Calcium: 7.9 mg/dL — ABNORMAL LOW (ref 8.9–10.3)
Chloride: 111 mmol/L (ref 101–111)
GFR calc Af Amer: >60 mL/min (ref >60)
Glucose, Bld: 101 mg/dL — ABNORMAL HIGH (ref 65–99)
Potassium: 4.3 mmol/L (ref 3.5–5.1)
SODIUM: 139 mmol/L (ref 135–145)

## 2017-05-16 LAB — GLUCOSE, CAPILLARY: GLUCOSE-CAPILLARY: 107 mg/dL — AB (ref 65–99)

## 2017-05-16 LAB — CBC WITH DIFFERENTIAL/PLATELET
BASOS ABS: 0 10*3/uL (ref 0.0–0.1)
Basophils Relative: 0 %
EOS ABS: 0.4 10*3/uL (ref 0.0–0.7)
EOS PCT: 4 %
HCT: 27.7 % — ABNORMAL LOW (ref 39.0–52.0)
Hemoglobin: 9.6 g/dL — ABNORMAL LOW (ref 13.0–17.0)
LYMPHS ABS: 2.3 10*3/uL (ref 0.7–4.0)
Lymphocytes Relative: 22 %
MCH: 30.6 pg (ref 26.0–34.0)
MCHC: 34.7 g/dL (ref 30.0–36.0)
MCV: 88.2 fL (ref 78.0–100.0)
Monocytes Absolute: 1.1 10*3/uL — ABNORMAL HIGH (ref 0.1–1.0)
Monocytes Relative: 11 %
Neutro Abs: 6.3 10*3/uL (ref 1.7–7.7)
Neutrophils Relative %: 63 %
PLATELETS: 196 10*3/uL (ref 150–400)
RBC: 3.14 MIL/uL — AB (ref 4.22–5.81)
RDW: 14.5 % (ref 11.5–15.5)
WBC: 10.1 10*3/uL (ref 4.0–10.5)

## 2017-05-16 LAB — HEMOGLOBIN AND HEMATOCRIT, BLOOD
HCT: 29.8 % — ABNORMAL LOW (ref 39.0–52.0)
Hemoglobin: 10.5 g/dL — ABNORMAL LOW (ref 13.0–17.0)

## 2017-05-16 MED ORDER — METRONIDAZOLE 500 MG PO TABS
500.0000 mg | ORAL_TABLET | Freq: Three times a day (TID) | ORAL | 0 refills | Status: AC
Start: 2017-05-16 — End: 2017-05-23

## 2017-05-16 MED ORDER — METOPROLOL TARTRATE 25 MG PO TABS: 12.5000 mg | ORAL_TABLET | Freq: Two times a day (BID) | ORAL | 0 refills | Status: AC

## 2017-05-16 MED ORDER — APIXABAN 2.5 MG PO TABS: 2.5000 mg | ORAL_TABLET | Freq: Two times a day (BID) | ORAL | 0 refills | Status: AC

## 2017-05-16 MED ORDER — VANCOMYCIN 50 MG/ML ORAL SOLUTION: 500.0000 mg | Freq: Four times a day (QID) | ORAL | 0 refills | Status: AC

## 2017-05-16 MED ORDER — ENSURE ENLIVE PO LIQD: 237.0000 mL | Freq: Two times a day (BID) | ORAL | 12 refills | Status: DC

## 2017-05-16 MED ORDER — POTASSIUM CHLORIDE CRYS ER 20 MEQ PO TBCR: 20.0000 meq | EXTENDED_RELEASE_TABLET | Freq: Every day | ORAL | 0 refills | Status: DC

## 2017-05-16 NOTE — Discharge Summary (Signed)
Physician Discharge Summary  David [name]  GMW:[id number]  DOB: 18-May-1935  DOA: 05/09/2017 PCP: Patient, No Pcp Per  Admit date: 05/09/2017 Discharge date: 05/16/2017  Admitted From: Home  Disposition:  Home   Recommendations for Outpatient Follow-up:  1. Follow up with PCP in 1 week  2. Please obtain BMP/CBC in one week to monitor Hgb and renal function  Home Health: RN/PT/Aide Equipment/Devices: None  Discharge Condition: Fair  CODE STATUS: FULL  Diet recommendation: Regular diet   Brief/Interim Summary: 82 year old male with medical history significant for dementia, chronic dysarthria, COPD and depression presented to the emergency department complaining of diarrhea. Patient was found to have C. difficile colitis and was admitted for antibiotic treatment. Hospital course was complicated with delirium, neurological evaluation was done which was negative. Subsequently patient developed an arrhythmia which was thought to be new onset afib, Cardiology was consulted and recommended a/c due to [facility] score of 3, family decided to start low dose Eliquis. Patient diarrhea improved and patient was evaluated by PT whom recommended physical therapy at SNF facily although family decided to take patient home with [facility]. Patient was discharge with oral vancomycin and flagyl to complete a 14 day course. Patient was advised to follow up with PCP in 1 week.   Subjective: Patient seen and examined on the day of discharge, patient interactive and cooperative. Diarrhea has improve, only 1 BM overnight well formed. Patient denies abdominal pain, nausea and vomiting. Patient continues to have poor appetite.   Discharge Diagnoses/Hospital Course:  C diff Colitis - improved  Pt presented with diarrhea, malaise, brief episode of chills, loss of appetite  Was on Keflex recently for suspected UTI  CT findings suggest infectious colitis Initially treated with Cipro and Flagyl, subsequently, C diff  was positive, and cipro was d/ced - patient was continued on Flagyl and Oral vancomycin was started  Blood culture and urine culture no growth and GI pathogen panel negative Continue with oral vancomycin 500 Q 6 hours and Flagyl 500 mg TID to complete 14 days course.  WBC normalized.  Follow up with PCP   Sepsis 2/2 to C diff colitis  Sepsis physiology has resolved  See above   Afib vs MAT  CHADVASCS 3 cardiology team favors a/c I discussed with family risk and benefits, family opted for a/c treatment with Eliquis 2.5 mg BID  Patient was started on metoprolol 12.5 mg  Patient will need to follow up with Cardiology - may need a Holter monitor  Severe protein calorie malnutrition  Continue protein shakes  Discussed with granddaughter that patient will regain appetite slowly as inflammation decreases.   Metabolic acidosis - resolved  secondary to GI losses has improved with IV fluids .   Hypomagnesemia - resolved   Acute hypoxic respiratory failure; - resolved off o2  Continue nebulizer PRN  CXR with ? Consolidation vs atelectasis - resolved without additional interventions  Swallow evaluation was perform as there was a thought of aspiration and was  recommended dysphagia 3 diet     Delirium due to infectious process - Resolved  Neurology was consulted whom agree with delirium diagnosis Family reporting the patient is having tremors. - not able to be visualized on my exams, could be myoclonus in setting of severe illness ABG. Was negative for hypercapnia.  CT head negative for stroke. EEGis abnormal due to moderatediffuse slowing of the background. EKG and troponin were negative .   Hyponatremia - resolved Treated with IV fluids  Hypokalemia due to  GI losses - resolved  Repleted with IV and oral supplementation  Check BMP in 1 week  Depression - stable  Continue Celexa   Anemia, occult blood in stool, Hgb remains stable  Hgb is 11.0 on admission, Hgb upon  discharge 10.5 No gross blood in stool, but FOBT+  Likely secondary to acute colitis Repeat CBC in 1 week    Incidental pancreatic lesion  Noted on CT abd/pelvis  Follow-up imaging in 2 years recommended as appropriate   All other chronic medical condition were stable during the hospitalization.  Patient was seen by physical therapy, recommending SNF, [facility] as [facility] are receiving this services already.  On the day of the discharge the patient's vitals were stable, and no other acute medical condition were reported by patient. Patient was felt safe to be discharge to home.  Discharge Instructions  You were cared for by a hospitalist during your hospital stay. If you have any questions about your discharge medications or the care you received while you were in the hospital after you are discharged, you can call the unit and asked to speak with the hospitalist on call if the hospitalist that took care of you is not available. Once you are discharged, your primary care physician will handle any further medical issues. Please note that NO REFILLS for any discharge medications will be authorized once you are discharged, as it is imperative that you return to your primary care physician (or establish a relationship with a primary care physician if you do not have one) for your aftercare needs so that they can reassess your need for medications and monitor your lab values.  Discharge Instructions    Call MD for:  difficulty breathing, headache or visual disturbances    Complete by:  As directed    Call MD for:  extreme fatigue    Complete by:  As directed    Call MD for:  hives    Complete by:  As directed    Call MD for:  persistant dizziness or light-headedness    Complete by:  As directed    Call MD for:  persistant nausea and vomiting    Complete by:  As directed    Call MD for:  redness, tenderness, or signs of infection (pain, swelling, redness, odor or green/yellow  discharge around incision site)    Complete by:  As directed    Call MD for:  severe uncontrolled pain    Complete by:  As directed    Call MD for:  temperature >100.4    Complete by:  As directed    Diet - low sodium heart healthy    Complete by:  As directed    Increase activity slowly    Complete by:  As directed      Allergies as of 05/16/2017   No Known Allergies     Medication List    STOP taking these medications   cephALEXin 500 MG capsule Commonly known as:  KEFLEX   ibuprofen 200 MG tablet Commonly known as:  ADVIL,MOTRIN   loratadine 10 MG tablet Commonly known as:  CLARITIN   PROBIATA PO     TAKE these medications   apixaban 2.5 MG Tabs tablet Commonly known as:  ELIQUIS Take 1 tablet (2.5 mg total) by mouth 2 (two) times daily.   citalopram 20 MG tablet Commonly known as:  CELEXA Take 20 mg by mouth daily.   feeding supplement (ENSURE ENLIVE) Liqd Take 237  mLs by mouth 2 (two) times daily between meals.   finasteride 5 MG tablet Commonly known as:  PROSCAR Take 5 mg by mouth daily.   gabapentin 300 MG capsule Commonly known as:  NEURONTIN Take 300 mg by mouth at bedtime.   Melatonin 3 MG Tabs Take 6 mg by mouth at bedtime.   metoprolol tartrate 25 MG tablet Commonly known as:  LOPRESSOR Take 0.5 tablets (12.5 mg total) by mouth 2 (two) times daily.   metroNIDAZOLE 500 MG tablet Commonly known as:  FLAGYL Take 1 tablet (500 mg total) by mouth every 8 (eight) hours.   multivitamin capsule Take 1 capsule by mouth daily.   potassium chloride SA 20 MEQ tablet Commonly known as:  K-DUR,KLOR-CON Take 1 tablet (20 mEq total) by mouth daily.   tamsulosin 0.4 MG Caps capsule Commonly known as:  FLOMAX Take 0.4 mg by mouth daily after supper.   vancomycin 50 mg/mL oral solution Commonly known as:  VANCOCIN Take 10 mLs (500 mg total) by mouth every 6 (six) hours.   Vitamin D3 1000 units Caps Take 1,000 Units by mouth daily.       Follow-up Information    [facility] Follow up.   Specialty:  Home Health Services Why:  Home Health Physical Therapy, RN and aide Contact information: [address] [place] [address] [phone]        Chilton Si, MD. Schedule an appointment as soon as possible for a visit in 2 week(s).   Specialty:  Cardiology Why:  Hospital follow up  Contact information: [address] [place] [address] [place] [address] [phone]          No Known Allergies  Consultations:  Cardiology   Neurology    Procedures/Studies: Dg Chest 2 View  Result Date: 05/09/2017 CLINICAL DATA:  Sudden onset weakness. EXAM: CHEST  2 VIEW COMPARISON:  04/29/2017 FINDINGS: AP and lateral views of the chest show hyperexpansion with chronic interstitial coarsening. No focal airspace consolidation. No pulmonary edema or pleural effusion. The cardiopericardial silhouette is within normal limits for size. Lower thoracic compression fracture is similar to prior. Telemetry leads overlie the chest. IMPRESSION: Stable.  Emphysema without acute cardiopulmonary findings. Electronically Signed   By: Kennith Center M.D.   On: 05/09/2017 15:58   Dg Chest 2 View  Result Date: 04/29/2017 CLINICAL DATA:  82 year old male with weakness and confusion. EXAM: CHEST  2 VIEW COMPARISON:  None FINDINGS: Shallow inspiration with bibasilar atelectasis/scarring. No focal consolidation, pleural effusion, or pneumothorax. Left hilar calcified granuloma. The cardiac silhouette is within normal limits. No acute osseous pathology. There is interposition of the transverse colon under the right hemidiaphragm. IMPRESSION: No active cardiopulmonary disease. Electronically Signed   By: Elgie Collard M.D.   On: 04/29/2017 04:33   Ct Head Wo Contrast  Result Date: 05/10/2017 CLINICAL DATA:  Advanced and dementia.  Colitis. EXAM: CT HEAD WITHOUT CONTRAST TECHNIQUE: Contiguous axial images were obtained  from the base of the skull through the vertex without intravenous contrast. COMPARISON:  CT 04/29/2017 FINDINGS: Brain: Advanced generalized brain atrophy. Advanced chronic small-vessel ischemic changes of the cerebral hemispheric white matter. Old small vessel infarctions of the basal ganglia and thalami. No sign of acute infarction, mass lesion, hemorrhage, hydrocephalus or extra-axial collection. Vascular: There is atherosclerotic calcification of the major vessels at the base of the brain. Skull: Normal Sinuses/Orbits: Clear/normal Other: None significant IMPRESSION: No acute or reversible finding by CT. Advanced generalized atrophy with chronic small-vessel  ischemic changes throughout the brain. Electronically Signed   By: Paulina FusiMark  Shogry M.D.   On: 05/10/2017 15:04   Ct Head Wo Contrast  Result Date: 04/29/2017 CLINICAL DATA:  82 year old male with weakness and dizziness. History of TIA. EXAM: CT HEAD WITHOUT CONTRAST TECHNIQUE: Contiguous axial images were obtained from the base of the skull through the vertex without intravenous contrast. COMPARISON:  None. FINDINGS: Brain: There is advanced age-related atrophy and chronic microvascular ischemic changes. No acute intracranial hemorrhage. No mass effect or midline shift. No extra-axial fluid collections. Vascular: No hyperdense vessel or unexpected calcification. Skull: Normal. Negative for fracture or focal lesion. Sinuses/Orbits: No acute finding. Other: None IMPRESSION: 1. No acute intracranial hemorrhage. 2. Age-related atrophy and chronic microvascular ischemic changes. If symptoms persist, and there are no contraindications, MRI may provide better evaluation if clinically indicated. Electronically Signed   By: Elgie CollardArash  Radparvar M.D.   On: 04/29/2017 04:45   Ct Abdomen Pelvis W Contrast  Result Date: 05/09/2017 CLINICAL DATA:  Lower abdominal pain and diarrhea today. Recent UTI. EXAM: CT ABDOMEN AND PELVIS WITH CONTRAST TECHNIQUE: Multidetector CT  imaging of the abdomen and pelvis was performed using the standard protocol following bolus administration of intravenous contrast. CONTRAST:  100mL ISOVUE-300 IOPAMIDOL (ISOVUE-300) INJECTION 61% COMPARISON:  None. FINDINGS: Lower chest: Calcified granuloma over the left lower lobe as lung bases otherwise within normal. Small hiatal hernia. Hepatobiliary: Within normal. Pancreas: Oval cystic lesion over the pancreatic tail measuring 1.2 x 2.1 cm. Centered over the main pancreatic duct. Spleen: A few calcified granulomas present. Adrenals/Urinary Tract: Adrenal glands are normal. Kidneys are normal in size without hydronephrosis or nephrolithiasis. No focal renal mass. Visualized portions of the ureters are within normal. The distal ureters are difficult to visualize as is the entire bladder due to streak artifact from adjacent hardware over the hips. Stomach/Bowel: Small hiatal hernia. Stomach and small bowel are otherwise unremarkable. Appendix is within normal. There is mild diffuse edema/wall thickening involving the ascending colon as well as descending and rectosigmoid colon suggesting a colitis of infectious or inflammatory nature. Vascular/Lymphatic: Moderate calcified plaque over the abdominal aorta and iliac arteries. No evidence of adenopathy. Reproductive: Within normal. Other: No significant free fluid. Musculoskeletal: Right total hip arthroplasty. Hardware over the left femur intact. Moderate degenerate changes spine with multiple compression fractures involving T11 and T12 as well as L1, L3 and L5 likely chronic, but age indeterminate. Mild degenerate change of the left hip. IMPRESSION: Mild wall thickening/ submucosal edema involving the ascending and descending/ rectosigmoid colon likely an acute colitis due to infectious or inflammatory nature. 2 cm cystic lesion over the tail of the pancreas centered over the main pancreatic duct. Recommend follow up pre and post contrast MRI/MRCP or pancreatic  protocol CT in 2 years. This recommendation follows ACR consensus guidelines: Management of Incidental Pancreatic Cysts: A White Paper of the ACR Incidental Findings Committee. J Am Coll Radiol 2017;14:911-923. Multiple spinal compression fractures as described likely chronic, although age indeterminate. Aortic Atherosclerosis (ICD10-I70.0). Electronically Signed   By: Elberta Fortisaniel  Boyle M.D.   On: 05/09/2017 15:50   Dg Chest Port 1 View  Result Date: 05/14/2017 CLINICAL DATA:  82 year old male with shortness of breath. EXAM: PORTABLE CHEST 1 VIEW COMPARISON:  Chest x-ray 05/10/2017. FINDINGS: Lung volumes are low. Bibasilar opacities may reflect areas of atelectasis and/or airspace consolidation. Small left pleural effusion. No evidence of pulmonary edema. Mild interstitial prominence and peribronchial cuffing, similar to prior examinations, likely chronic. Heart size is normal. The  patient is rotated to the left on today's exam, resulting in distortion of the mediastinal contours and reduced diagnostic sensitivity and specificity for mediastinal pathology. Atherosclerosis in the thoracic aorta. Numerous calcified mediastinal and bilateral hilar lymph nodes. Interposition of the colon beneath the right hemidiaphragm again noted. IMPRESSION: 1. Low lung volumes with bibasilar areas of atelectasis and/or consolidation and small left pleural effusion. 2. Aortic atherosclerosis. Electronically Signed   By: Trudie Reed M.D.   On: 05/14/2017 09:22   Dg Chest Port 1 View  Result Date: 05/10/2017 CLINICAL DATA:  Increased dyspnea and weakness this morning EXAM: PORTABLE CHEST 1 VIEW COMPARISON:  Portable exam 1113 hours compared to 05/09/2017 FINDINGS: Normal heart size, mediastinal contours, and pulmonary vascularity. Atherosclerotic calcifications aorta. Emphysematous changes without infiltrate, pleural effusion or pneumothorax. Calcified adenopathy at LEFT hilum. Bones demineralized. IMPRESSION: Emphysematous  and old granulomatous disease changes. No acute abnormalities. Aortic Atherosclerosis (ICD10-I70.0) and Emphysema (ICD10-J43.9). Electronically Signed   By: Ulyses Southward M.D.   On: 05/10/2017 11:26   EEG on 05/11/2017 Impression: This awake and drowsy EEG is abnormal due to moderate diffuse slowing of the background.  Clinical Correlation of the above findings indicates diffuse cerebral dysfunction that is non-specific in etiology and can be seen with hypoxic/ischemic injury, toxic/metabolic encephalopathies, neurodegenerative disorders, or medication effect.  The absence of epileptiform discharges does not rule out a clinical diagnosis of epilepsy.  Clinical correlation is advised.  Patrcia Dolly, M.D.  Discharge Exam: Vitals:   05/15/17 2035 05/16/17 0500  BP: (!) 154/75 137/60  Pulse: 84 68  Resp: 18 18  Temp: 98.4 F (36.9 C) 98.6 F (37 C)   Vitals:   05/15/17 2043 05/16/17 0500 05/16/17 0804 05/16/17 0806  BP:  137/60    Pulse:  68    Resp:  18    Temp:  98.6 F (37 C)    TempSrc:  Oral    SpO2: 96% 98% 96% 96%  Weight:  69.7 kg (153 lb 9.6 oz)    Height:        General: NAD Cardiovascular: RRR, S1/S2 +, no rubs, no gallops Respiratory: CTA bilaterally, no wheezing, no rhonchi Abdominal: Soft, NT, ND, bowel sounds + Extremities: no edema  The results of significant diagnostics from this hospitalization (including imaging, microbiology, ancillary and laboratory) are listed below for reference.     Microbiology: Recent Results (from the past 240 hour(s))  Urine culture     Status: None   Collection Time: 05/09/17  1:22 PM  Result Value Ref Range Status   Specimen Description URINE, CATHETERIZED  Final   Special Requests Normal  Final   Culture   Final    NO GROWTH Performed at [facility], [address] [place], [place] [address]    Report Status 05/11/2017 FINAL  Final  Blood culture (routine x 2)     Status: None   Collection Time: 05/09/17  1:40  PM  Result Value Ref Range Status   Specimen Description BLOOD RIGHT ARM  Final   Special Requests   Final    BOTTLES DRAWN AEROBIC AND ANAEROBIC Blood Culture adequate volume   Culture   Final    NO GROWTH 5 DAYS Performed at [facility], [address] [place], [place] [address]    Report Status 05/14/2017 FINAL  Final  C difficile quick scan w PCR reflex     Status: Abnormal   Collection Time: 05/09/17  3:00 PM  Result Value Ref  Range Status   C Diff antigen POSITIVE (A) NEGATIVE Final   C Diff toxin POSITIVE (A) NEGATIVE Final   C Diff interpretation Toxin producing C. difficile detected.  Final    Comment: CRITICAL RESULT CALLED TO, READ BACK BY AND VERIFIED WITH: D. [id number] 07.22.2018 N. MORRIS   Gastrointestinal Panel by PCR , Stool     Status: None   Collection Time: 05/09/17  4:34 PM  Result Value Ref Range Status   Campylobacter species NOT DETECTED NOT DETECTED Final   Plesimonas shigelloides NOT DETECTED NOT DETECTED Final   Salmonella species NOT DETECTED NOT DETECTED Final   Yersinia enterocolitica NOT DETECTED NOT DETECTED Final   Vibrio species NOT DETECTED NOT DETECTED Final   Vibrio cholerae NOT DETECTED NOT DETECTED Final   Enteroaggregative E coli (EAEC) NOT DETECTED NOT DETECTED Final   Enteropathogenic E coli (EPEC) NOT DETECTED NOT DETECTED Final   Enterotoxigenic E coli (ETEC) NOT DETECTED NOT DETECTED Final   Shiga like toxin producing E coli (STEC) NOT DETECTED NOT DETECTED Final   Shigella/Enteroinvasive E coli (EIEC) NOT DETECTED NOT DETECTED Final   Cryptosporidium NOT DETECTED NOT DETECTED Final   Cyclospora cayetanensis NOT DETECTED NOT DETECTED Final   Entamoeba histolytica NOT DETECTED NOT DETECTED Final   Giardia lamblia NOT DETECTED NOT DETECTED Final   Adenovirus F40/41 NOT DETECTED NOT DETECTED Final   Astrovirus NOT DETECTED NOT DETECTED Final   Norovirus GI/GII NOT DETECTED NOT DETECTED Final   Rotavirus A NOT DETECTED NOT  DETECTED Final   Sapovirus (I, II, IV, and V) NOT DETECTED NOT DETECTED Final  MRSA PCR Screening     Status: None   Collection Time: 05/10/17  3:29 PM  Result Value Ref Range Status   MRSA by PCR NEGATIVE NEGATIVE Final    Comment:        The GeneXpert MRSA Assay (FDA approved for NASAL specimens only), is one component of a comprehensive MRSA colonization surveillance program. It is not intended to diagnose MRSA infection nor to guide or monitor treatment for MRSA infections.      Labs: BNP (last 3 results) No results for input(s): BNP in the last 8760 hours. Basic Metabolic Panel:  Recent Labs Lab 05/11/17 0332 05/12/17 0311 05/12/17 1309 05/14/17 0312 05/15/17 0920 05/16/17 0436  NA 135 139 140 136 140 139  K 3.4* 3.5 3.6 3.1* 4.2 4.3  CL 113* 115* 113* 111 110 111  CO2 18* 21* 21* 21* 24 25  GLUCOSE 148* 135* 144* 130* 120* 101*  BUN 9 <5* <5* <5* 6 10  CREATININE 0.77 0.64 0.69 0.61 0.67 0.63  CALCIUM 6.9* 7.5* 7.8* 7.7* 8.0* 7.9*  MG 1.6*  --  2.0 1.7 1.8  --    Liver Function Tests:  Recent Labs Lab 05/11/17 0057 05/12/17 1309  AST 27  --   ALT 13*  --   ALKPHOS 35*  --   BILITOT 0.2*  --   PROT 4.6*  --   ALBUMIN 2.6* 2.8*   No results for input(s): LIPASE, AMYLASE in the last 168 hours.  Recent Labs Lab 05/10/17 1605  AMMONIA 15   CBC:  Recent Labs Lab 05/10/17 0531  05/11/17 0332 05/12/17 0311 05/14/17 0312 05/15/17 0920 05/16/17 0436 05/16/17 1413  WBC 18.8*  < > 19.9* 18.2* 13.5* 17.1* 10.1  --   NEUTROABS 15.4*  --   --   --  9.8* 11.1* 6.3  --   HGB 11.7*  < >  10.7* 10.9* 10.7* 11.1* 9.6* 10.5*  HCT 34.3*  < > 31.0* 31.2* 30.4* 32.0* 27.7* 29.8*  MCV 89.1  < > 89.9 88.9 86.1 88.2 88.2  --   PLT 198  < > 174 169 174 310 196  --   < > = values in this interval not displayed. Cardiac Enzymes:  Recent Labs Lab 05/10/17 1423 05/10/17 1927 05/11/17 0057  TROPONINI <0.03 <0.03 <0.03   BNP: Invalid input(s):  POCBNP CBG:  Recent Labs Lab 05/12/17 0724 05/13/17 0845 05/14/17 0743 05/15/17 0748 05/16/17 0951  GLUCAP 127* 115* 117* 108* 107*   D-Dimer No results for input(s): DDIMER in the last 72 hours. Hgb A1c No results for input(s): HGBA1C in the last 72 hours. Lipid Profile No results for input(s): CHOL, HDL, LDLCALC, TRIG, CHOLHDL, LDLDIRECT in the last 72 hours. Thyroid function studies No results for input(s): TSH, T4TOTAL, T3FREE, THYROIDAB in the last 72 hours.  Invalid input(s): FREET3 Anemia work up No results for input(s): VITAMINB12, FOLATE, FERRITIN, TIBC, IRON, RETICCTPCT in the last 72 hours. Urinalysis    Component Value Date/Time   COLORURINE YELLOW 05/09/2017 1322   APPEARANCEUR CLEAR 05/09/2017 1322   LABSPEC 1.012 05/09/2017 1322   PHURINE 5.5 05/09/2017 1322   GLUCOSEU NEGATIVE 05/09/2017 1322   HGBUR NEGATIVE 05/09/2017 1322   BILIRUBINUR NEGATIVE 05/09/2017 1322   KETONESUR NEGATIVE 05/09/2017 1322   PROTEINUR NEGATIVE 05/09/2017 1322   NITRITE NEGATIVE 05/09/2017 1322   LEUKOCYTESUR TRACE (A) 05/09/2017 1322   Sepsis Labs Invalid input(s): PROCALCITONIN,  WBC,  LACTICIDVEN Microbiology Recent Results (from the past 240 hour(s))  Urine culture     Status: None   Collection Time: 05/09/17  1:22 PM  Result Value Ref Range Status   Specimen Description URINE, CATHETERIZED  Final   Special Requests Normal  Final   Culture   Final    NO GROWTH Performed at [facility], [address], [place], [place] [address]    Report Status 05/11/2017 FINAL  Final  Blood culture (routine x 2)     Status: None   Collection Time: 05/09/17  1:40 PM  Result Value Ref Range Status   Specimen Description BLOOD RIGHT ARM  Final   Special Requests   Final    BOTTLES DRAWN AEROBIC AND ANAEROBIC Blood Culture adequate volume   Culture   Final    NO GROWTH 5 DAYS Performed at [facility], [address] [place], [place] [address]    Report Status  05/14/2017 FINAL  Final  C difficile quick scan w PCR reflex     Status: Abnormal   Collection Time: 05/09/17  3:00 PM  Result Value Ref Range Status   C Diff antigen POSITIVE (A) NEGATIVE Final   C Diff toxin POSITIVE (A) NEGATIVE Final   C Diff interpretation Toxin producing C. difficile detected.  Final    Comment: CRITICAL RESULT CALLED TO, READ BACK BY AND VERIFIED WITH: D. [id number] [id number] 07.22.2018 N. MORRIS   Gastrointestinal Panel by PCR , Stool     Status: None   Collection Time: 05/09/17  4:34 PM  Result Value Ref Range Status   Campylobacter species NOT DETECTED NOT DETECTED Final   Plesimonas shigelloides NOT DETECTED NOT DETECTED Final   Salmonella species NOT DETECTED NOT DETECTED Final   Yersinia enterocolitica NOT DETECTED NOT DETECTED Final   Vibrio species NOT DETECTED NOT DETECTED Final   Vibrio cholerae NOT DETECTED NOT DETECTED Final   Enteroaggregative E  coli (EAEC) NOT DETECTED NOT DETECTED Final   Enteropathogenic E coli (EPEC) NOT DETECTED NOT DETECTED Final   Enterotoxigenic E coli (ETEC) NOT DETECTED NOT DETECTED Final   Shiga like toxin producing E coli (STEC) NOT DETECTED NOT DETECTED Final   Shigella/Enteroinvasive E coli (EIEC) NOT DETECTED NOT DETECTED Final   Cryptosporidium NOT DETECTED NOT DETECTED Final   Cyclospora cayetanensis NOT DETECTED NOT DETECTED Final   Entamoeba histolytica NOT DETECTED NOT DETECTED Final   Giardia lamblia NOT DETECTED NOT DETECTED Final   Adenovirus F40/41 NOT DETECTED NOT DETECTED Final   Astrovirus NOT DETECTED NOT DETECTED Final   Norovirus GI/GII NOT DETECTED NOT DETECTED Final   Rotavirus A NOT DETECTED NOT DETECTED Final   Sapovirus (I, II, IV, and V) NOT DETECTED NOT DETECTED Final  MRSA PCR Screening     Status: None   Collection Time: 05/10/17  3:29 PM  Result Value Ref Range Status   MRSA by PCR NEGATIVE NEGATIVE Final    Comment:        The GeneXpert MRSA Assay (FDA approved for NASAL specimens only),  is one component of a comprehensive MRSA colonization surveillance program. It is not intended to diagnose MRSA infection nor to guide or monitor treatment for MRSA infections.     Time coordinating discharge: 38 minutes  SIGNED:  Latrelle Dodrill, MD  [facility] 05/16/2017, 9:20 PM  Pager please text page via  [email or web address] Password [email or web address]

## 2017-05-16 NOTE — Progress Notes (Signed)
Progress Note  Patient Name: David [name] Rosenboom Date of Encounter: 05/16/2017  Primary Cardiologist: Duke Salviaandolph  Subjective   No chest pain dyspnea   Inpatient Medications    Scheduled Meds: . budesonide (PULMICORT) nebulizer solution  0.25 mg Nebulization BID  . calcium carbonate  1 tablet Oral BID WC  . citalopram  20 mg Oral Daily  . feeding supplement (ENSURE ENLIVE)  237 mL Oral BID BM  . finasteride  5 mg Oral Daily  . gabapentin  300 mg Oral QHS  . ipratropium-albuterol  3 mL Nebulization Q12H  . loratadine  10 mg Oral Daily  . metoprolol tartrate  12.5 mg Oral BID  . metroNIDAZOLE  500 mg Oral Q8H  . multivitamin with minerals  1 tablet Oral Daily  . potassium chloride  40 mEq Oral BID  . sodium chloride flush  3 mL Intravenous Q12H  . tamsulosin  0.4 mg Oral QPC breakfast  . vancomycin  500 mg Oral Q6H   Continuous Infusions:  PRN Meds: acetaminophen **OR** acetaminophen, ipratropium-albuterol   Vital Signs    Vitals:   05/15/17 2043 05/16/17 0500 05/16/17 0804 05/16/17 0806  BP:  137/60    Pulse:  68    Resp:  18    Temp:  98.6 F (37 C)    TempSrc:  Oral    SpO2: 96% 98% 96% 96%  Weight:  153 lb 9.6 oz (69.7 kg)    Height:        Intake/Output Summary (Last 24 hours) at 05/16/17 1112 Last data filed at 05/16/17 0500  Gross per 24 hour  Intake              103 ml  Output              725 ml  Net             -622 ml   Filed Weights   05/14/17 0500 05/15/17 0600 05/16/17 0500  Weight: 153 lb 10.6 oz (69.7 kg) 152 lb 1.9 oz (69 kg) 153 lb 9.6 oz (69.7 kg)    Telemetry    NSR rate 60-70 05/16/2017  - Personally Reviewed  ECG     afib RBBB rate 104 - Personally Reviewed  Physical Exam  Dementia GEN: No acute distress.   Neck: No JVD Cardiac: RRR, no murmurs, rubs, or gallops.  Respiratory: Clear to auscultation bilaterally. GI: Soft, nontender, non-distended  MS: No edema; No deformity. Neuro:  Nonfocal    Labs     Chemistry Recent Labs Lab 05/09/17 1320  05/11/17 0057  05/12/17 1309 05/14/17 0312 05/15/17 0920 05/16/17 0436  NA 134*  < >  --   < > 140 136 140 139  K 3.7  < >  --   < > 3.6 3.1* 4.2 4.3  CL 102  < >  --   < > 113* 111 110 111  CO2 24  < >  --   < > 21* 21* 24 25  GLUCOSE 123*  < >  --   < > 144* 130* 120* 101*  BUN 16  < >  --   < > <5* <5* 6 10  CREATININE 1.04  < >  --   < > 0.69 0.61 0.67 0.63  CALCIUM 8.7*  < >  --   < > 7.8* 7.7* 8.0* 7.9*  PROT 6.3*  --  4.6*  --   --   --   --   --  ALBUMIN 3.7  --  2.6*  --  2.8*  --   --   --   AST 17  --  27  --   --   --   --   --   ALT 12*  --  13*  --   --   --   --   --   ALKPHOS 59  --  35*  --   --   --   --   --   BILITOT 0.5  --  0.2*  --   --   --   --   --   GFRNONAA >60  < >  --   < > >60 >60 >60 >60  GFRAA >60  < >  --   < > >60 >60 >60 >60  ANIONGAP 8  < >  --   < > 6 4* 6 3*  < > = values in this interval not displayed.   Hematology Recent Labs Lab 05/14/17 0312 05/15/17 0920 05/16/17 0436  WBC 13.5* 17.1* 10.1  RBC 3.53* 3.63* 3.14*  HGB 10.7* 11.1* 9.6*  HCT 30.4* 32.0* 27.7*  MCV 86.1 88.2 88.2  MCH 30.3 30.6 30.6  MCHC 35.2 34.7 34.7  RDW 14.2 14.3 14.5  PLT 174 310 196    Cardiac Enzymes Recent Labs Lab 05/09/17 1321 05/10/17 1423 05/10/17 1927 05/11/17 0057  TROPONINI <0.03 <0.03 <0.03 <0.03   No results for input(s): TROPIPOC in the last 168 hours.   BNPNo results for input(s): BNP, PROBNP in the last 168 hours.   DDimer No results for input(s): DDIMER in the last 168 hours.   Radiology    No results found.  Cardiac Studies   None  Patient Profile     Mr. Warth has a PMH significant for TIA, COPD, dementia, dysarthria, depression, and history of aspiration. [facility] weakness, confusion, and diarreha. He was admitted and found to have Cdiff and was admitted for ABX administration. He was subsequently found to be in Afib vs MAT.  David [name]  General is a 82 y.o. male who is being seen today for the evaluation of Afib at the request of Dr. [name].  Assessment & Plan    PAF:  In NSR beta blocker increased by Dr Ane Paymentandolph Marginal candidate for anticoagulation given dementia and falls Can consider low dose eliquis 2.5 bid. Dr Duke Salviaandolph did not start as no HCPA present Will sign off and primary care can discuss with family  No further cardiac w/u planned   Signed, Charlton HawsPeter Ladarien Beeks, MD  05/16/2017, 11:12 AM

## 2017-05-20 ENCOUNTER — Emergency Department (HOSPITAL_COMMUNITY): Payer: Medicare Other

## 2017-05-20 ENCOUNTER — Encounter (HOSPITAL_COMMUNITY): Payer: Self-pay | Admitting: Obstetrics and Gynecology

## 2017-05-20 ENCOUNTER — Emergency Department (HOSPITAL_COMMUNITY)
Admission: EM | Admit: 2017-05-20 | Discharge: 2017-05-20 | Disposition: A | Discharge status: Home / Self Care | Payer: Medicare Other | Attending: Emergency Medicine | Admitting: Emergency Medicine

## 2017-05-20 DIAGNOSIS — Z79899 Other long term (current) drug therapy: Secondary | ICD-10-CM | POA: Diagnosis not present

## 2017-05-20 DIAGNOSIS — R2243 Localized swelling, mass and lump, lower limb, bilateral: Secondary | ICD-10-CM | POA: Diagnosis present

## 2017-05-20 DIAGNOSIS — J449 Chronic obstructive pulmonary disease, unspecified: Secondary | ICD-10-CM | POA: Insufficient documentation

## 2017-05-20 DIAGNOSIS — R600 Localized edema: Secondary | ICD-10-CM | POA: Insufficient documentation

## 2017-05-20 DIAGNOSIS — Z87891 Personal history of nicotine dependence: Secondary | ICD-10-CM | POA: Diagnosis not present

## 2017-05-20 DIAGNOSIS — Z96643 Presence of artificial hip joint, bilateral: Secondary | ICD-10-CM | POA: Insufficient documentation

## 2017-05-20 DIAGNOSIS — R609 Edema, unspecified: Secondary | ICD-10-CM

## 2017-05-20 LAB — COMPREHENSIVE METABOLIC PANEL
ALT: 28 U/L (ref 17–63)
AST: 45 U/L — AB (ref 15–41)
Albumin: 3.2 g/dL — ABNORMAL LOW (ref 3.5–5.0)
Alkaline Phosphatase: 47 U/L (ref 38–126)
Anion gap: 6 (ref 5–15)
BILIRUBIN TOTAL: 0.4 mg/dL (ref 0.3–1.2)
BUN: 13 mg/dL (ref 6–20)
CALCIUM: 8.4 mg/dL — AB (ref 8.9–10.3)
CHLORIDE: 105 mmol/L (ref 101–111)
CO2: 26 mmol/L (ref 22–32)
CREATININE: 0.86 mg/dL (ref 0.61–1.24)
Glucose, Bld: 103 mg/dL — ABNORMAL HIGH (ref 65–99)
Potassium: 4.1 mmol/L (ref 3.5–5.1)
Sodium: 137 mmol/L (ref 135–145)
TOTAL PROTEIN: 5.6 g/dL — AB (ref 6.5–8.1)

## 2017-05-20 LAB — URINALYSIS, ROUTINE W REFLEX MICROSCOPIC
Bilirubin Urine: NEGATIVE
Glucose, UA: NEGATIVE mg/dL
Hgb urine dipstick: NEGATIVE
Ketones, ur: NEGATIVE mg/dL
Nitrite: POSITIVE — AB
PH: 6 (ref 5.0–8.0)
Protein, ur: NEGATIVE mg/dL
SPECIFIC GRAVITY, URINE: 1.017 (ref 1.005–1.030)
SQUAMOUS EPITHELIAL / LPF: NONE SEEN

## 2017-05-20 LAB — BRAIN NATRIURETIC PEPTIDE: B NATRIURETIC PEPTIDE 5: 204.1 pg/mL — AB (ref 0.0–100.0)

## 2017-05-20 LAB — CBC WITH DIFFERENTIAL/PLATELET
BASOS ABS: 0 10*3/uL (ref 0.0–0.1)
BASOS PCT: 0 %
EOS ABS: 0.4 10*3/uL (ref 0.0–0.7)
EOS PCT: 3 %
HCT: 33.2 % — ABNORMAL LOW (ref 39.0–52.0)
HEMOGLOBIN: 11.3 g/dL — AB (ref 13.0–17.0)
LYMPHS ABS: 2.5 10*3/uL (ref 0.7–4.0)
Lymphocytes Relative: 20 %
MCH: 31 pg (ref 26.0–34.0)
MCHC: 34 g/dL (ref 30.0–36.0)
MCV: 91 fL (ref 78.0–100.0)
Monocytes Absolute: 1.1 10*3/uL — ABNORMAL HIGH (ref 0.1–1.0)
Monocytes Relative: 9 %
NEUTROS PCT: 68 %
Neutro Abs: 8.4 10*3/uL — ABNORMAL HIGH (ref 1.7–7.7)
PLATELETS: 280 10*3/uL (ref 150–400)
RBC: 3.65 MIL/uL — AB (ref 4.22–5.81)
RDW: 15.2 % (ref 11.5–15.5)
WBC: 12.3 10*3/uL — AB (ref 4.0–10.5)

## 2017-05-20 LAB — TROPONIN I

## 2017-05-20 LAB — LIPASE, BLOOD: LIPASE: 25 U/L (ref 11–51)

## 2017-05-20 MED ORDER — HYDROCHLOROTHIAZIDE 25 MG PO TABS: 25.0000 mg | ORAL_TABLET | Freq: Every day | ORAL | 0 refills | Status: DC | PRN

## 2017-05-20 NOTE — ED Notes (Signed)
PT REQUESTED LAB DRAW WIT IV START RN IS AWARE

## 2017-05-20 NOTE — ED Notes (Signed)
Attending RN and extra RN attempted IV access multiple times without success.  IV team consulted. Family requested no more sticks be done until  IV team is able to see patient.  Delay in lab draw.

## 2017-05-20 NOTE — ED Provider Notes (Signed)
[facility]-EMERGENCY DEPT Provider Note   CSN: [id number] Arrival date & time: 05/20/17  1656     History   Chief Complaint Chief Complaint  Patient presents with  . Leg Swelling    HPI David [name] is a 82 y.o. male.  Pt was admitted from 7/21-28 for c.diff and new onset a.fib.  He was d/c home with home health due to family request.  He did qualify for SNF.  According to EMS, he has some new swelling in both lower legs.  He denies any pain or sob to me.  He said he feels fine.  Pt's granddaughter said the home health nurse told her to bring him here.      Past Medical History:  Diagnosis Date  . Aspiration into airway   . COPD (chronic obstructive pulmonary disease) (HCC)   . Pneumonia   . Stroke [facility](HCC)    TIA's per CT scan    Patient Active Problem List   Diagnosis Date Noted  . Paroxysmal atrial fibrillation (HCC)   . Multifocal atrial tachycardia (HCC)   . Protein-calorie malnutrition, severe 05/12/2017  . Colitis 05/09/2017  . Depression 05/09/2017  . Hyponatremia 05/09/2017  . Normocytic anemia 05/09/2017  . Dementia 05/09/2017  . Pancreatic lesion 05/09/2017  . Colitis presumed infectious 05/09/2017  . Occult blood positive stool 05/09/2017  . Diarrhea of presumed infectious origin     Past Surgical History:  Procedure Laterality Date  . JOINT REPLACEMENT Bilateral    Hip replacemen       Home Medications    Prior to Admission medications   Medication Sig Start Date End Date Taking? Authorizing Provider  apixaban (ELIQUIS) 2.5 MG TABS tablet Take 1 tablet (2.5 mg total) by mouth 2 (two) times daily. 05/16/17  Yes Randel PiggSilva Zapata, Dorma RussellEdwin, MD  Cholecalciferol (VITAMIN D3) 1000 units CAPS Take 1,000 Units by mouth daily.   Yes [provider]  citalopram (CELEXA) 20 MG tablet Take 20 mg by mouth daily.   Yes [provider]  feeding supplement, ENSURE ENLIVE, (ENSURE ENLIVE) LIQD Take 237 mLs by mouth 2 (two) times daily between meals.  05/17/17  Yes Randel PiggSilva Zapata, Dorma RussellEdwin, MD  finasteride (PROSCAR) 5 MG tablet Take 5 mg by mouth daily.   Yes [provider]  gabapentin (NEURONTIN) 300 MG capsule Take 300 mg by mouth at bedtime.    Yes [provider]  Melatonin 3 MG TABS Take 6 mg by mouth at bedtime.   Yes [provider]  metoprolol tartrate (LOPRESSOR) 25 MG tablet Take 0.5 tablets (12.5 mg total) by mouth 2 (two) times daily. 05/16/17  Yes Randel PiggSilva Zapata, Dorma RussellEdwin, MD  metroNIDAZOLE (FLAGYL) 500 MG tablet Take 1 tablet (500 mg total) by mouth every 8 (eight) hours. 05/16/17 05/23/17 Yes Lenox PondsSilva Zapata, Edwin, MD  Multiple Vitamin (MULTIVITAMIN) capsule Take 1 capsule by mouth daily.   Yes [provider]  potassium chloride SA (K-DUR,KLOR-CON) 20 MEQ tablet Take 1 tablet (20 mEq total) by mouth daily. 05/16/17  Yes Randel PiggSilva Zapata, Dorma RussellEdwin, MD  tamsulosin (FLOMAX) 0.4 MG CAPS capsule Take 0.4 mg by mouth daily after supper.    Yes [provider]  vancomycin (VANCOCIN) 50 mg/mL oral solution Take 10 mLs (500 mg total) by mouth every 6 (six) hours. 05/16/17 05/23/17 Yes Lenox PondsSilva Zapata, Edwin, MD  hydrochlorothiazide (HYDRODIURIL) 25 MG tablet Take 1 tablet (25 mg total) by mouth daily as needed (swelling). 05/20/17   Jacalyn LefevreHaviland, Merari Pion, MD    Family History Family  History  Problem Relation Age of Onset  . Hypertension Father     Social History Social History  Substance Use Topics  . Smoking status: Former Games developer  . Smokeless tobacco: Never Used  . Alcohol use No     Allergies   Patient has no known allergies.   Review of Systems Review of Systems  Musculoskeletal:       Bilateral leg swelling     Physical Exam Updated Vital Signs BP (!) 141/77 (BP Location: Right Arm)   Pulse (!) 54   Temp (!) 97.4 F (36.3 C) (Oral)   Resp 18   Ht 5\' 6"  (1.676 m)   Wt 69.9 kg (154 lb)   SpO2 98%   BMI 24.86 kg/m   Physical Exam  Constitutional: He is oriented to person, place, and time. He  appears well-developed and well-nourished.  HENT:  Head: Normocephalic and atraumatic.  Right Ear: External ear normal.  Left Ear: External ear normal.  Nose: Nose normal.  Mouth/Throat: Oropharynx is clear and moist.  Eyes: Pupils are equal, round, and reactive to light. Conjunctivae and EOM are normal.  Neck: Normal range of motion. Neck supple.  Cardiovascular: Normal rate, regular rhythm, normal heart sounds and intact distal pulses.   Pulmonary/Chest: Effort normal and breath sounds normal.  Abdominal: Soft. Bowel sounds are normal.  Musculoskeletal: He exhibits edema.  Neurological: He is alert and oriented to person, place, and time.  Skin: Skin is warm.  Psychiatric: He has a normal mood and affect. His behavior is normal. Judgment and thought content normal.  Nursing note and vitals reviewed.    ED Treatments / Results  Labs (all labs ordered are listed, but only abnormal results are displayed) Labs Reviewed  COMPREHENSIVE METABOLIC PANEL - Abnormal; Notable for the following:       Result Value   Glucose, Bld 103 (*)    Calcium 8.4 (*)    Total Protein 5.6 (*)    Albumin 3.2 (*)    AST 45 (*)    All other components within normal limits  CBC WITH DIFFERENTIAL/PLATELET - Abnormal; Notable for the following:    WBC 12.3 (*)    RBC 3.65 (*)    Hemoglobin 11.3 (*)    HCT 33.2 (*)    Neutro Abs 8.4 (*)    Monocytes Absolute 1.1 (*)    All other components within normal limits  URINALYSIS, ROUTINE W REFLEX MICROSCOPIC - Abnormal; Notable for the following:    Color, Urine AMBER (*)    APPearance HAZY (*)    Nitrite POSITIVE (*)    Leukocytes, UA TRACE (*)    Bacteria, UA RARE (*)    All other components within normal limits  BRAIN NATRIURETIC PEPTIDE - Abnormal; Notable for the following:    B Natriuretic Peptide 204.1 (*)    All other components within normal limits  TROPONIN I  LIPASE, BLOOD    EKG  EKG Interpretation  Date/Time:  Wednesday May 20 2017 18:27:03 EDT Ventricular Rate:  56 PR Interval:  166 QRS Duration: 88 QT Interval:  426 QTC Calculation: 411 R Axis:   -44 Text Interpretation:  Sinus bradycardia Left axis deviation Pulmonary disease pattern RSR' or QR pattern in V1 suggests right ventricular conduction delay Nonspecific T wave abnormality Abnormal ECG no longer in a.fib Confirmed by Jacalyn Lefevre [id number]) on 05/20/2017 6:36:50 PM       Radiology Dg Chest 2 View  Result Date: 05/20/2017 CLINICAL DATA:  Shortness  of breath EXAM: CHEST  2 VIEW COMPARISON:  05/14/2017 FINDINGS: Lungs are clear. Small bilateral pleural effusions, best visualized on the lateral view. No pneumothorax. The heart is normal in size.  Calcified mediastinal lymph nodes. Mild degenerative changes of the visualized thoracolumbar spine. IMPRESSION: Small bilateral pleural effusions. Electronically Signed   By: Charline BillsSriyesh  Krishnan M.D.   On: 05/20/2017 18:00    Procedures Procedures (including critical care time)  Medications Ordered in ED Medications - No data to display   Initial Impression / Assessment and Plan / ED Course  I have reviewed the triage vital signs and the nursing notes.  Pertinent labs & imaging results that were available during my care of the patient were reviewed by me and considered in my medical decision making (see chart for details).   Pt will be started on HCTZ 25 mg as needed for swelling.  Family told to get compression hose.  Pt knows to return if worse.   Final Clinical Impressions(s) / ED Diagnoses   Final diagnoses:  Peripheral edema    New Prescriptions New Prescriptions   HYDROCHLOROTHIAZIDE (HYDRODIURIL) 25 MG TABLET    Take 1 tablet (25 mg total) by mouth daily as needed (swelling).     Jacalyn LefevreHaviland, Jimmy Stipes, MD 05/20/17 2211

## 2017-05-20 NOTE — ED Triage Notes (Signed)
Per EMS:  Pt c/o bilateral edema both legs. Legs are not warm or red to the touch. Pt reported a pain of 2/10.  Pt was here on Saturday for tx of c-diff. Pt reports that his legs are weaker than normal. Pt has a hx of dementia. Pt caregiver is granddaughter. Pt is currently on antibiotics. Vancomyacin, flagyl, metoprolol and Eloquis.  Granddaughter reports to EMS that he is at baseline.

## 2017-05-20 NOTE — ED Notes (Signed)
Writer attempted IV x2 with no success

## 2017-05-20 NOTE — ED Notes (Signed)
Bed: WA03 Expected date:  Expected time:  Means of arrival:  Comments: 82 yo m bilateral edema

## 2017-05-20 NOTE — ED Notes (Signed)
IV team paged.  

## 2017-05-20 NOTE — Discharge Instructions (Signed)
Compression hose to legs. 

## 2017-05-25 LAB — URINE CULTURE: Culture: 80000 — AB

## 2017-05-25 NOTE — ED Notes (Signed)
Notified about urine culture result.  Per Dr. Fayrene FearingJames, pt advised to follow up with primary care.  Return to ED if urinary symptoms occur or fevers.  Pt has dementia.  Family member given result.

## 2017-05-28 ENCOUNTER — Inpatient Hospital Stay (HOSPITAL_COMMUNITY)
Admission: EM | Admit: 2017-05-28 | Discharge: 2017-06-01 | DRG: 690 | Disposition: A | Discharge status: Skilled Nursing Facility | Payer: Medicare Other | Attending: Internal Medicine | Admitting: Internal Medicine

## 2017-05-28 ENCOUNTER — Encounter (HOSPITAL_COMMUNITY): Payer: Self-pay | Admitting: Emergency Medicine

## 2017-05-28 ENCOUNTER — Emergency Department (HOSPITAL_COMMUNITY): Payer: Medicare Other

## 2017-05-28 DIAGNOSIS — N400 Enlarged prostate without lower urinary tract symptoms: Secondary | ICD-10-CM | POA: Diagnosis present

## 2017-05-28 DIAGNOSIS — E861 Hypovolemia: Secondary | ICD-10-CM | POA: Diagnosis present

## 2017-05-28 DIAGNOSIS — Z7901 Long term (current) use of anticoagulants: Secondary | ICD-10-CM

## 2017-05-28 DIAGNOSIS — K529 Noninfective gastroenteritis and colitis, unspecified: Secondary | ICD-10-CM | POA: Diagnosis present

## 2017-05-28 DIAGNOSIS — N390 Urinary tract infection, site not specified: Secondary | ICD-10-CM | POA: Diagnosis not present

## 2017-05-28 DIAGNOSIS — I10 Essential (primary) hypertension: Secondary | ICD-10-CM | POA: Diagnosis present

## 2017-05-28 DIAGNOSIS — Z96643 Presence of artificial hip joint, bilateral: Secondary | ICD-10-CM | POA: Diagnosis present

## 2017-05-28 DIAGNOSIS — F0390 Unspecified dementia without behavioral disturbance: Secondary | ICD-10-CM | POA: Diagnosis present

## 2017-05-28 DIAGNOSIS — Z79899 Other long term (current) drug therapy: Secondary | ICD-10-CM

## 2017-05-28 DIAGNOSIS — J449 Chronic obstructive pulmonary disease, unspecified: Secondary | ICD-10-CM | POA: Diagnosis present

## 2017-05-28 DIAGNOSIS — R531 Weakness: Secondary | ICD-10-CM

## 2017-05-28 DIAGNOSIS — B965 Pseudomonas (aeruginosa) (mallei) (pseudomallei) as the cause of diseases classified elsewhere: Secondary | ICD-10-CM | POA: Diagnosis present

## 2017-05-28 DIAGNOSIS — D649 Anemia, unspecified: Secondary | ICD-10-CM | POA: Diagnosis present

## 2017-05-28 DIAGNOSIS — E871 Hypo-osmolality and hyponatremia: Secondary | ICD-10-CM | POA: Diagnosis present

## 2017-05-28 DIAGNOSIS — Z8673 Personal history of transient ischemic attack (TIA), and cerebral infarction without residual deficits: Secondary | ICD-10-CM

## 2017-05-28 DIAGNOSIS — Z87891 Personal history of nicotine dependence: Secondary | ICD-10-CM

## 2017-05-28 DIAGNOSIS — I480 Paroxysmal atrial fibrillation: Secondary | ICD-10-CM | POA: Diagnosis present

## 2017-05-28 DIAGNOSIS — F329 Major depressive disorder, single episode, unspecified: Secondary | ICD-10-CM | POA: Diagnosis present

## 2017-05-28 LAB — COMPREHENSIVE METABOLIC PANEL
ALBUMIN: 3.4 g/dL — AB (ref 3.5–5.0)
ALK PHOS: 45 U/L (ref 38–126)
ALT: 26 U/L (ref 17–63)
ANION GAP: 5 (ref 5–15)
AST: 31 U/L (ref 15–41)
BUN: 12 mg/dL (ref 6–20)
CHLORIDE: 96 mmol/L — AB (ref 101–111)
CO2: 30 mmol/L (ref 22–32)
Calcium: 8.6 mg/dL — ABNORMAL LOW (ref 8.9–10.3)
Creatinine, Ser: 0.99 mg/dL (ref 0.61–1.24)
GFR calc non Af Amer: >60 mL/min (ref >60)
GLUCOSE: 108 mg/dL — AB (ref 65–99)
Potassium: 4.1 mmol/L (ref 3.5–5.1)
Sodium: 131 mmol/L — ABNORMAL LOW (ref 135–145)
Total Bilirubin: 0.6 mg/dL (ref 0.3–1.2)
Total Protein: 5.6 g/dL — ABNORMAL LOW (ref 6.5–8.1)

## 2017-05-28 LAB — CBC WITH DIFFERENTIAL/PLATELET
BASOS PCT: 0 %
Basophils Absolute: 0 10*3/uL (ref 0.0–0.1)
EOS PCT: 1 %
Eosinophils Absolute: 0.2 10*3/uL (ref 0.0–0.7)
HCT: 30.5 % — ABNORMAL LOW (ref 39.0–52.0)
HEMOGLOBIN: 10.3 g/dL — AB (ref 13.0–17.0)
LYMPHS ABS: 2 10*3/uL (ref 0.7–4.0)
Lymphocytes Relative: 15 %
MCH: 30 pg (ref 26.0–34.0)
MCHC: 33.8 g/dL (ref 30.0–36.0)
MCV: 88.9 fL (ref 78.0–100.0)
Monocytes Absolute: 1.3 10*3/uL — ABNORMAL HIGH (ref 0.1–1.0)
Monocytes Relative: 10 %
NEUTROS PCT: 74 %
Neutro Abs: 9.7 10*3/uL — ABNORMAL HIGH (ref 1.7–7.7)
PLATELETS: 260 10*3/uL (ref 150–400)
RBC: 3.43 MIL/uL — AB (ref 4.22–5.81)
RDW: 14.8 % (ref 11.5–15.5)
WBC: 13.2 10*3/uL — AB (ref 4.0–10.5)

## 2017-05-28 LAB — URINALYSIS, ROUTINE W REFLEX MICROSCOPIC
Bilirubin Urine: NEGATIVE
GLUCOSE, UA: NEGATIVE mg/dL
KETONES UR: NEGATIVE mg/dL
NITRITE: NEGATIVE
PROTEIN: 100 mg/dL — AB
Specific Gravity, Urine: 1.013 (ref 1.005–1.030)
Squamous Epithelial / LPF: NONE SEEN
pH: 8 (ref 5.0–8.0)

## 2017-05-28 LAB — I-STAT CG4 LACTIC ACID, ED: Lactic Acid, Venous: 0.74 mmol/L (ref 0.5–1.9)

## 2017-05-28 LAB — MAGNESIUM: MAGNESIUM: 1.7 mg/dL (ref 1.7–2.4)

## 2017-05-28 MED ORDER — CITALOPRAM HYDROBROMIDE 20 MG PO TABS
20.0000 mg | ORAL_TABLET | Freq: Every day | ORAL | Status: DC
  Administered 2017-05-29 – 2017-06-01 (×4): 20 mg via ORAL
  Filled 2017-05-28 (×4): qty 1

## 2017-05-28 MED ORDER — VANCOMYCIN 50 MG/ML ORAL SOLUTION
125.0000 mg | Freq: Four times a day (QID) | ORAL | Status: DC
  Administered 2017-05-28 – 2017-06-01 (×14): 125 mg via ORAL
  Filled 2017-05-28 (×18): qty 2.5

## 2017-05-28 MED ORDER — METOPROLOL TARTRATE 12.5 MG HALF TABLET
12.5000 mg | ORAL_TABLET | Freq: Two times a day (BID) | ORAL | Status: DC
  Administered 2017-05-28 – 2017-06-01 (×7): 12.5 mg via ORAL
  Filled 2017-05-28 (×8): qty 1

## 2017-05-28 MED ORDER — DEXTROSE 5 % IV SOLN
1.0000 g | Freq: Three times a day (TID) | INTRAVENOUS | Status: AC
  Administered 2017-05-28 – 2017-06-01 (×11): 1 g via INTRAVENOUS
  Filled 2017-05-28 (×12): qty 1

## 2017-05-28 MED ORDER — ENSURE ENLIVE PO LIQD
237.0000 mL | Freq: Every day | ORAL | Status: DC
  Administered 2017-05-29: 237 mL via ORAL

## 2017-05-28 MED ORDER — POLYETHYLENE GLYCOL 3350 17 G PO PACK: 17.0000 g | PACK | Freq: Every day | ORAL | Status: DC | PRN

## 2017-05-28 MED ORDER — SODIUM CHLORIDE 0.9 % IV BOLUS (SEPSIS)
1000.0000 mL | Freq: Once | INTRAVENOUS | Status: AC
  Administered 2017-05-28: 1000 mL via INTRAVENOUS

## 2017-05-28 MED ORDER — ENSURE ENLIVE PO LIQD: 237.0000 mL | ORAL | Status: DC

## 2017-05-28 MED ORDER — FINASTERIDE 5 MG PO TABS
5.0000 mg | ORAL_TABLET | Freq: Every day | ORAL | Status: DC
  Administered 2017-05-29 – 2017-06-01 (×4): 5 mg via ORAL
  Filled 2017-05-28 (×4): qty 1

## 2017-05-28 MED ORDER — APIXABAN 2.5 MG PO TABS
2.5000 mg | ORAL_TABLET | Freq: Two times a day (BID) | ORAL | Status: DC
  Administered 2017-05-28 – 2017-06-01 (×8): 2.5 mg via ORAL
  Filled 2017-05-28 (×9): qty 1

## 2017-05-28 MED ORDER — ACETAMINOPHEN 325 MG PO TABS: 650.0000 mg | ORAL_TABLET | Freq: Four times a day (QID) | ORAL | Status: DC | PRN

## 2017-05-28 MED ORDER — TAMSULOSIN HCL 0.4 MG PO CAPS
0.4000 mg | ORAL_CAPSULE | Freq: Every day | ORAL | Status: DC
  Administered 2017-05-28 – 2017-05-31 (×4): 0.4 mg via ORAL
  Filled 2017-05-28 (×4): qty 1

## 2017-05-28 MED ORDER — ACETAMINOPHEN 650 MG RE SUPP: 650.0000 mg | Freq: Four times a day (QID) | RECTAL | Status: DC | PRN

## 2017-05-28 MED ORDER — SODIUM CHLORIDE 0.9 % IV SOLN
INTRAVENOUS | Status: DC
  Administered 2017-05-28 – 2017-05-29 (×3): via INTRAVENOUS

## 2017-05-28 MED ORDER — CEFTAZIDIME 1 G IJ SOLR
1.0000 g | Freq: Once | INTRAMUSCULAR | Status: DC
  Filled 2017-05-28: qty 1

## 2017-05-28 NOTE — ED Notes (Signed)
Condom cath placed on patient to obtain urine specimen.

## 2017-05-28 NOTE — ED Notes (Signed)
Granddaughter informed of Bed assignment

## 2017-05-28 NOTE — ED Notes (Addendum)
Pt. In and Out cath was unsuccessful. Pt. Care giver stated that he had a enlarge prostate.  No urine return. Pt. Had some blood return while inserting the cath. Nurse aware.

## 2017-05-28 NOTE — ED Notes (Signed)
Granddaughter at bedside, who patient lives with states patient was diagnosed with UTI here previously and would like pt checked for on going c-diff.

## 2017-05-28 NOTE — ED Notes (Signed)
Bed: WA07 Expected date:  Expected time:  Means of arrival:  Comments: 56M hypotension, weakness

## 2017-05-28 NOTE — ED Notes (Signed)
ED TO INPATIENT HANDOFF REPORT  Name/Age/Gender David [name] 82 y.o. male  Home/SNF/Other Home  Chief Complaint Generalized Weakness, UTI, Hypotension   Code Status History    Date Active Date Inactive Code Status Order ID Comments User Context   05/09/2017  8:04 PM 05/16/2017  9:03 PM Full Code [id number]  Vianne Bulls, MD Inpatient    Advance Directive Documentation     Most Recent Value  Type of Advance Directive  Living will, Out of facility DNR (pink MOST or yellow form)  Pre-existing out of facility DNR order (yellow form or pink MOST form)  --  "MOST" Form in Place?  --      Level of Care/Admitting Diagnosis ED Disposition    ED Disposition Condition Comment   Admit  Hospital Area: [facility] [[id number]]  Level of Care: Med-Surg [16]  Diagnosis: UTI (urinary tract infection) [932671]  Admitting Physician: Vashti Hey [[id number]]  Attending Physician: Vashti Hey [[id number]]  PT Class (Do Not Modify): Observation [104]  PT Acc Code (Do Not Modify): Observation [10022]       Medical History Past Medical History:  Diagnosis Date  . Aspiration into airway   . COPD (chronic obstructive pulmonary disease) ([facility])   . Pneumonia   . Stroke [facility])    TIA's per CT scan    Allergies No Known Allergies  IV Location/Drains/Wounds Patient Lines/Drains/Airways Status   Active Line/Drains/Airways    Name:   Placement date:   Placement time:   Site:   Days:   Peripheral IV 05/28/17 Right Antecubital  05/28/17    1332    Antecubital    less than 1   Wound / Incision (Open or Dehisced) 05/09/17 Other (Comment) Hand Right  05/09/17    1900    Hand    19          Labs/Imaging Results for orders placed or performed during the hospital encounter of 05/28/17 (from the past 48 hour(s))  CBC with Differential     Status: Abnormal   Collection Time: 05/28/17  1:22 PM  Result Value Ref Range   WBC 13.2 (H) 4.0 - 10.5 K/uL    RBC 3.43 (L) 4.22 - 5.81 MIL/uL   Hemoglobin 10.3 (L) 13.0 - 17.0 g/dL   HCT 30.5 (L) 39.0 - 52.0 %   MCV 88.9 78.0 - 100.0 fL   MCH 30.0 26.0 - 34.0 pg   MCHC 33.8 30.0 - 36.0 g/dL   RDW 14.8 11.5 - 15.5 %   Platelets 260 150 - 400 K/uL   Neutrophils Relative % 74 %   Neutro Abs 9.7 (H) 1.7 - 7.7 K/uL   Lymphocytes Relative 15 %   Lymphs Abs 2.0 0.7 - 4.0 K/uL   Monocytes Relative 10 %   Monocytes Absolute 1.3 (H) 0.1 - 1.0 K/uL   Eosinophils Relative 1 %   Eosinophils Absolute 0.2 0.0 - 0.7 K/uL   Basophils Relative 0 %   Basophils Absolute 0.0 0.0 - 0.1 K/uL  Comprehensive metabolic panel     Status: Abnormal   Collection Time: 05/28/17  1:22 PM  Result Value Ref Range   Sodium 131 (L) 135 - 145 mmol/L   Potassium 4.1 3.5 - 5.1 mmol/L   Chloride 96 (L) 101 - 111 mmol/L   CO2 30 22 - 32 mmol/L   Glucose, Bld 108 (H) 65 - 99 mg/dL   BUN 12 6 - 20 mg/dL   Creatinine, Ser 0.99  0.61 - 1.24 mg/dL   Calcium 8.6 (L) 8.9 - 10.3 mg/dL   Total Protein 5.6 (L) 6.5 - 8.1 g/dL   Albumin 3.4 (L) 3.5 - 5.0 g/dL   AST 31 15 - 41 U/L   ALT 26 17 - 63 U/L   Alkaline Phosphatase 45 38 - 126 U/L   Total Bilirubin 0.6 0.3 - 1.2 mg/dL   GFR calc non Af Amer >60 >60 mL/min   GFR calc Af Amer >60 >60 mL/min    Comment: (NOTE) The eGFR has been calculated using the CKD EPI equation. This calculation has not been validated in all clinical situations. eGFR's persistently <60 mL/min signify possible Chronic Kidney Disease.    Anion gap 5 5 - 15  Magnesium     Status: None   Collection Time: 05/28/17  1:22 PM  Result Value Ref Range   Magnesium 1.7 1.7 - 2.4 mg/dL  I-Stat CG4 Lactic Acid, ED     Status: None   Collection Time: 05/28/17  1:29 PM  Result Value Ref Range   Lactic Acid, Venous 0.74 0.5 - 1.9 mmol/L   Dg Chest 2 View  Result Date: 05/28/2017 CLINICAL DATA:  Generalized weakness EXAM: CHEST  2 VIEW COMPARISON:  05/20/2017 FINDINGS: Heart size normal. Negative for heart failure.  Atherosclerotic aorta. Negative for infiltrate. Small left effusion. Mild lower thoracic compression fracture is unchanged. IMPRESSION: Small left effusion. Electronically Signed   By: Franchot Gallo M.D.   On: 05/28/2017 11:42    Pending Labs Unresulted Labs    Start     Ordered   05/28/17 1118  Urinalysis, Routine w reflex microscopic  Once,   R     05/28/17 1118   05/28/17 1118  Urine culture  STAT,   STAT     05/28/17 1118      Isolation Precautions No active isolations  Vitals/Pain Today's Vitals   05/28/17 1230 05/28/17 1238 05/28/17 1300 05/28/17 1400  BP: 106/90 (!) 103/59 105/71 131/62  Pulse: (!) 57 (!) 56 (!) 55 (!) 54  Resp: 13 12 11 14   Temp:  98.3 F (36.8 C)    TempSrc:  Oral    SpO2: 96% 94% 95% 97%  Weight:      Height:        Medications Medications  cefTAZidime (FORTAZ) 1 g in dextrose 5 % 50 mL IVPB (not administered)  vancomycin (VANCOCIN) 50 mg/mL oral solution 125 mg (not administered)  sodium chloride 0.9 % bolus 1,000 mL (1,000 mLs Intravenous New Bag/Given 05/28/17 1328)    Mobility non-ambulatory

## 2017-05-28 NOTE — ED Provider Notes (Signed)
[facility]-EMERGENCY DEPT Provider Note   CSN: [id number] Arrival date & time: 05/28/17  1035     History   Chief Complaint Chief Complaint  Patient presents with  . Weakness    HPI Armida SansRobert E Karabin is a 82 y.o. male.  The history is provided by the patient.  Weakness  Primary symptoms include no focal weakness. This is a new problem. The current episode started 12 to 24 hours ago. The problem has been gradually worsening. There was no focality noted. There has been no fever. Pertinent negatives include no shortness of breath, no vomiting and no altered mental status. There were no medications administered prior to arrival. Associated medical issues include dementia and CVA.   82 year old male with history of dementia, paroxysmal atrial fibrillation on Eliquis, COPD, and prior TIA who presents with generalized weakness. History is provided by patient's granddaughter who is his caregiver. Level 5 caveat due to dementia. Patient was recently hospitalized for C. difficile colitis after treatment for UTI recently and just finished his vancomycin and Flagyl 4 days ago. States that the diarrhea has improved, but he still continues to pass some mucus that is foul-smelling. Recently was called by the hospital about a week ago that his urine culture was positive for pseudomonas, but since he was not having symptoms will not treat. Family has noticed that for the last day he has had significant generalized weakness. States that he feels lightheaded when they tried to get him up. His. More pale and deconditioned than usual. He's also had significant coughing but no sputum or fever.   Past Medical History:  Diagnosis Date  . Aspiration into airway   . COPD (chronic obstructive pulmonary disease) (HCC)   . Pneumonia   . Stroke [facility](HCC)    TIA's per CT scan    Patient Active Problem List   Diagnosis Date Noted  . Acute lower UTI 05/28/2017  . Paroxysmal atrial fibrillation (HCC)   . Multifocal atrial  tachycardia (HCC)   . Protein-calorie malnutrition, severe 05/12/2017  . Colitis 05/09/2017  . Depression 05/09/2017  . Hyponatremia 05/09/2017  . Normocytic anemia 05/09/2017  . Dementia 05/09/2017  . Pancreatic lesion 05/09/2017  . Colitis presumed infectious 05/09/2017  . Occult blood positive stool 05/09/2017  . Diarrhea of presumed infectious origin     Past Surgical History:  Procedure Laterality Date  . JOINT REPLACEMENT Bilateral    Hip replacemen       Home Medications    Prior to Admission medications   Medication Sig Start Date End Date Taking? Authorizing Provider  apixaban (ELIQUIS) 2.5 MG TABS tablet Take 1 tablet (2.5 mg total) by mouth 2 (two) times daily. 05/16/17  Yes Randel PiggSilva Zapata, Dorma RussellEdwin, MD  Cholecalciferol (VITAMIN D3) 1000 units CAPS Take 1,000 Units by mouth daily with breakfast.    Yes [provider]  citalopram (CELEXA) 20 MG tablet Take 20 mg by mouth daily with breakfast.    Yes [provider]  feeding supplement, ENSURE ENLIVE, (ENSURE ENLIVE) LIQD Take 237 mLs by mouth 2 (two) times daily between meals. Patient taking differently: Take 237 mLs by mouth daily.  05/17/17  Yes Randel PiggSilva Zapata, Dorma RussellEdwin, MD  finasteride (PROSCAR) 5 MG tablet Take 5 mg by mouth daily with breakfast.    Yes [provider]  gabapentin (NEURONTIN) 300 MG capsule Take 300 mg by mouth at bedtime.    Yes [provider]  hydrochlorothiazide (HYDRODIURIL) 25 MG tablet Take 1 tablet (25 mg total) by mouth  daily as needed (swelling). 05/20/17  Yes Jacalyn Lefevre, MD  Melatonin 3 MG TABS Take 6 mg by mouth at bedtime.   Yes [provider]  metoprolol tartrate (LOPRESSOR) 25 MG tablet Take 0.5 tablets (12.5 mg total) by mouth 2 (two) times daily. 05/16/17  Yes Lenox Ponds, MD  Multiple Vitamin (MULTIVITAMIN) capsule Take 1 capsule by mouth daily.   Yes [provider]  potassium chloride SA (K-DUR,KLOR-CON) 20 MEQ tablet Take 1  tablet (20 mEq total) by mouth daily. Patient taking differently: Take 20 mEq by mouth daily with breakfast.  05/16/17  Yes Randel Pigg, Dorma Russell, MD  tamsulosin (FLOMAX) 0.4 MG CAPS capsule Take 0.4 mg by mouth daily after supper.    Yes [provider]    Family History Family History  Problem Relation Age of Onset  . Hypertension Father     Social History Social History  Substance Use Topics  . Smoking status: Former Games developer  . Smokeless tobacco: Never Used  . Alcohol use No     Allergies   Patient has no known allergies.   Review of Systems Review of Systems  Constitutional: Positive for fatigue. Negative for fever.  Respiratory: Negative for shortness of breath.   Gastrointestinal: Negative for vomiting.  Neurological: Positive for weakness. Negative for focal weakness.  All other systems reviewed and are negative.    Physical Exam Updated Vital Signs BP 131/62   Pulse (!) 54   Temp 98.3 F (36.8 C) (Oral)   Resp 14   Ht 5\' 6"  (1.676 m)   Wt 69.9 kg (154 lb)   SpO2 97%   BMI 24.86 kg/m   Physical Exam Physical Exam  Nursing note and vitals reviewed. Constitutional: Appears cachectic and chronically ill-appearing, appears listless, non-toxic, and in no acute distress Head: Normocephalic and atraumatic.  Mouth/Throat: Oropharynx is clear and dry mucous membranes.  Neck: Normal range of motion. Neck supple.  Cardiovascular: Normal rate and regular rhythm.   Pulmonary/Chest: Effort normal and breath sounds normal.  Abdominal: Soft. There is no tenderness. There is no rebound and no guarding.  Musculoskeletal: Normal range of motion.  Neurological: Alert, no facial droop, equal bilateral hand grip, moves all extremities symmetrically to command Skin: Skin is warm and dry.  Psychiatric: Cooperative   ED Treatments / Results  Labs (all labs ordered are listed, but only abnormal results are displayed) Labs Reviewed  CBC WITH DIFFERENTIAL/PLATELET -  Abnormal; Notable for the following:       Result Value   WBC 13.2 (*)    RBC 3.43 (*)    Hemoglobin 10.3 (*)    HCT 30.5 (*)    Neutro Abs 9.7 (*)    Monocytes Absolute 1.3 (*)    All other components within normal limits  COMPREHENSIVE METABOLIC PANEL - Abnormal; Notable for the following:    Sodium 131 (*)    Chloride 96 (*)    Glucose, Bld 108 (*)    Calcium 8.6 (*)    Total Protein 5.6 (*)    Albumin 3.4 (*)    All other components within normal limits  URINE CULTURE  MAGNESIUM  URINALYSIS, ROUTINE W REFLEX MICROSCOPIC  I-STAT CG4 LACTIC ACID, ED  I-STAT CG4 LACTIC ACID, ED    EKG  EKG Interpretation None       Radiology Dg Chest 2 View  Result Date: 05/28/2017 CLINICAL DATA:  Generalized weakness EXAM: CHEST  2 VIEW COMPARISON:  05/20/2017 FINDINGS: Heart size normal. Negative for  heart failure. Atherosclerotic aorta. Negative for infiltrate. Small left effusion. Mild lower thoracic compression fracture is unchanged. IMPRESSION: Small left effusion. Electronically Signed   By: Marlan Palau M.D.   On: 05/28/2017 11:42    Procedures Procedures (including critical care time)  Medications Ordered in ED Medications  cefTAZidime (FORTAZ) 1 g in dextrose 5 % 50 mL IVPB (not administered)  vancomycin (VANCOCIN) 50 mg/mL oral solution 125 mg (not administered)  sodium chloride 0.9 % bolus 1,000 mL (1,000 mLs Intravenous New Bag/Given 05/28/17 1328)     Initial Impression / Assessment and Plan / ED Course  I have reviewed the triage vital signs and the nursing notes.  Pertinent labs & imaging results that were available during my care of the patient were reviewed by me and considered in my medical decision making (see chart for details).     Presenting with generalized weakness and fatigue. Afebrile and hemodynamically stable. Appears dry on exam. Records reviewed. Urine culture from 8 days ago does grow positive for Pseudomonas, and apparently it was a catheterized  urine. Suspect that this may be symptomatic UTI at this time. UA is sensitive to cefazolin, and given a course of IV antibiotics. Given just finishing C. difficile treatment will also start on oral vancomycin again given that we are repeating cephalosporins.  Blood work with mild hyponatremia and mild leukocytosis. Chest x-ray visualized shows no acute cardiopulmonary processes. Did attempt to catheterize for urine again, but no significant urine today. Given IV fluids is likely dry and dehydrated. Discussed with hospitalist service who will admit for ongoing treatment.  Final Clinical Impressions(s) / ED Diagnoses   Final diagnoses:  Generalized weakness  Lower urinary tract infectious disease    New Prescriptions New Prescriptions   No medications on file     Lavera Guise, MD 05/28/17 [phone]

## 2017-05-28 NOTE — ED Triage Notes (Signed)
Per [facility] pt from home called out due to family wanting pt evaluated for generalized weakness and possible UTI. Pt alert to self, hx of dementia. Pt currently on antibiotics for c-diff. Pt denies any complaints.

## 2017-05-28 NOTE — H&P (Signed)
History and Physical:    David [name]   ZOX:[id number] DOB: 27-Jan-1935 DOA: 05/28/2017  Referring MD/provider: Dr Verdie Mosher PCP: System, Pcp Not In   Patient coming from: home  Chief Complaint: Generalized weakness  History of Present Illness:   History is per ED note and physician. Patient is unable to provide any history. Granddaughter had just left and was not reachable.  David [name] is an 82 y.o. male with past medical history significant for mention, paroxysmal atrial fibrillation on Eliquis, COPD, cerebrovascular disease who was recently in house for treatment of C. difficile colitis who now is brought in by his granddaughter with whom he lives for generalized weakness. Patient apparently had just finished his treatment of vancomycin and Flagyl 4 days ago but over the past 2 days he has been weaker than usual. He apparently had stated that he felt lightheaded when it was time to get up. Of note patient was known to have Pseudomonas growing in his urine last week however since he was asymptomatic it was opted not to treat him.  ED Course:  Workup revealed an afebrile patient who was somewhat listless and nontoxic in appearance. He was noted to be somewhat bradycardic at 54 although blood pressure was within normal limits. White count was elevated at 13 and his sodium was down to 131 decreased from 137 at last admission. Patient was thought to be dehydrated likely secondary to infection and decreased by mouth intake. He was treated with IV fluids and ceftazidime to cover Pseudomonas.  ROS:   ROS patient is unable to provide a review of systems due to dementia  Past Medical History:   Past Medical History:  Diagnosis Date  . Aspiration into airway   . COPD (chronic obstructive pulmonary disease) (HCC)   . Pneumonia   . Stroke [facility])    TIA's per CT scan    Past Surgical History:   Past Surgical History:  Procedure Laterality Date  . JOINT REPLACEMENT Bilateral    Hip replacemen    Social History:   Social History   Social History  . Marital status: Widowed    Spouse name: N/A  . Number of children: N/A  . Years of education: N/A   Occupational History  . Not on file.   Social History Main Topics  . Smoking status: Former Games developer  . Smokeless tobacco: Never Used  . Alcohol use No  . Drug use: No  . Sexual activity: Not on file   Other Topics Concern  . Not on file   Social History Narrative  . No narrative on file    Allergies   Patient has no known allergies.  Family history:   Family History  Problem Relation Age of Onset  . Hypertension Father     Current Medications:   Prior to Admission medications   Medication Sig Start Date End Date Taking? Authorizing Provider  apixaban (ELIQUIS) 2.5 MG TABS tablet Take 1 tablet (2.5 mg total) by mouth 2 (two) times daily. 05/16/17  Yes Randel Pigg, Dorma Russell, MD  Cholecalciferol (VITAMIN D3) 1000 units CAPS Take 1,000 Units by mouth daily with breakfast.    Yes [provider]  citalopram (CELEXA) 20 MG tablet Take 20 mg by mouth daily with breakfast.    Yes [provider]  feeding supplement, ENSURE ENLIVE, (ENSURE ENLIVE) LIQD Take 237 mLs by mouth 2 (two) times daily between meals. Patient taking differently: Take 237 mLs by mouth daily.  05/17/17  Yes  Randel Pigg, Dorma Russell, MD  finasteride (PROSCAR) 5 MG tablet Take 5 mg by mouth daily with breakfast.    Yes [provider]  gabapentin (NEURONTIN) 300 MG capsule Take 300 mg by mouth at bedtime.    Yes [provider]  hydrochlorothiazide (HYDRODIURIL) 25 MG tablet Take 1 tablet (25 mg total) by mouth daily as needed (swelling). 05/20/17  Yes Jacalyn Lefevre, MD  Melatonin 3 MG TABS Take 6 mg by mouth at bedtime.   Yes [provider]  metoprolol tartrate (LOPRESSOR) 25 MG tablet Take 0.5 tablets (12.5 mg total) by mouth 2 (two) times daily. 05/16/17  Yes Lenox Ponds, MD  Multiple  Vitamin (MULTIVITAMIN) capsule Take 1 capsule by mouth daily.   Yes [provider]  potassium chloride SA (K-DUR,KLOR-CON) 20 MEQ tablet Take 1 tablet (20 mEq total) by mouth daily. Patient taking differently: Take 20 mEq by mouth daily with breakfast.  05/16/17  Yes Randel Pigg, Dorma Russell, MD  tamsulosin (FLOMAX) 0.4 MG CAPS capsule Take 0.4 mg by mouth daily after supper.    Yes [provider]    Physical Exam:   Vitals:   05/28/17 1430 05/28/17 1500 05/28/17 1530 05/28/17 1602  BP: 131/71 (!) 150/69 (!) 141/78 (!) 157/135  Pulse: (!) 45 (!) 55 63 64  Resp: 11 12 17 17   Temp:    (!) 97.3 F (36.3 C)  TempSrc:    Oral  SpO2: 94% 94% 93% 95%  Weight:      Height:         Physical Exam: Blood pressure (!) 157/135, pulse 64, temperature (!) 97.3 F (36.3 C), temperature source Oral, resp. rate 17, height 5\' 6"  (1.676 m), weight 69.9 kg (154 lb), SpO2 95 %. Gen: Weak-appearing listless gentleman lying in bed in no acute distress. He was able to answer simple yes no questions appropriately. Head: Normocephalic, atraumatic. Eyes: Sclera anicteric conjunctiva pale but not injected Neck: No JVP  Chest: Reasonable air entry with few rales at bases.  CV: Regular, bradycardia, distant heart sounds Abdomen: Positive bowel sounds, firm but not hard nontender no guarding no rebound  Extremities: Extremities are without clubbing, or cyanosis. No edema.  Skin: Warm and dry. No rashes, lesions or wounds. Neuro: Awake and alert but clearly has dementia. Grossly nonfocal Psych: Patient has dementia  Data Review:    Labs: Basic Metabolic Panel:  Recent Labs Lab 05/28/17 1322  NA 131*  K 4.1  CL 96*  CO2 30  GLUCOSE 108*  BUN 12  CREATININE 0.99  CALCIUM 8.6*  MG 1.7   Liver Function Tests:  Recent Labs Lab 05/28/17 1322  AST 31  ALT 26  ALKPHOS 45  BILITOT 0.6  PROT 5.6*  ALBUMIN 3.4*   No results for input(s): LIPASE, AMYLASE in the last 168  hours. No results for input(s): AMMONIA in the last 168 hours. CBC:  Recent Labs Lab 05/28/17 1322  WBC 13.2*  NEUTROABS 9.7*  HGB 10.3*  HCT 30.5*  MCV 88.9  PLT 260   Cardiac Enzymes: No results for input(s): CKTOTAL, CKMB, CKMBINDEX, TROPONINI in the last 168 hours.  BNP (last 3 results) No results for input(s): PROBNP in the last 8760 hours. CBG: No results for input(s): GLUCAP in the last 168 hours.  Urinalysis    Component Value Date/Time   COLORURINE AMBER (A) 05/20/2017 1938   APPEARANCEUR HAZY (A) 05/20/2017 1938   LABSPEC 1.017 05/20/2017 1938   PHURINE 6.0 05/20/2017 1938  GLUCOSEU NEGATIVE 05/20/2017 1938   HGBUR NEGATIVE 05/20/2017 1938   BILIRUBINUR NEGATIVE 05/20/2017 1938   KETONESUR NEGATIVE 05/20/2017 1938   PROTEINUR NEGATIVE 05/20/2017 1938   NITRITE POSITIVE (A) 05/20/2017 1938   LEUKOCYTESUR TRACE (A) 05/20/2017 1938      Radiographic Studies: Dg Chest 2 View  Result Date: 05/28/2017 CLINICAL DATA:  Generalized weakness EXAM: CHEST  2 VIEW COMPARISON:  05/20/2017 FINDINGS: Heart size normal. Negative for heart failure. Atherosclerotic aorta. Negative for infiltrate. Small left effusion. Mild lower thoracic compression fracture is unchanged. IMPRESSION: Small left effusion. Electronically Signed   By: Marlan Palauharles  Clark M.D.   On: 05/28/2017 11:42    EKG: Ordered and pending   Assessment/Plan:   Active Problems:   Hyponatremia   Dementia   Colitis presumed infectious   Paroxysmal atrial fibrillation (HCC)   Acute lower UTI   UTI (urinary tract infection)  GENERALIZED WEAKNESS Most likely secondary to urinary tract infection and decreased by mouth intake. Sodium is 131 decreased from 137 suggestive of possible hypovolemia Will treat with normal saline hydration overnight and treatment of UTI as below.  UTI WITH PSEUDOMONAS Patient grew out Pseudomonas last week which was thought to be asymptomatic bactiuria however given new symptoms  this is likely source of malaise and fatigue. We'll continue Ceptaz edema as started in ER. Await culture results from today.  RECENT TX FOR C DIFF Patient has only recently stopped his treatment for C. difficile 4 days ago. Given unfortunate reinitiation of antibiotics, will restart vancomycin orally to try to stave off another bout of C. difficile colitis.  HYPONATREMIA As noted above, likely secondary to hypovolemia secondary to decreased by mouth intake.  Will treat with normal saline hydration.   PAF VS MAT Discussed at last hospital stay patient has either paroxysmal A. fib or multifocal atrial tachycardia. This was discussed extensively with family. They have opted to treat patient with Eliquis. We'll continue treatment with metoprolol for rate control as well.  HTN Patient is usually on hydrochlorothiazide and metoprolol. We will hold HCTZ given hyponatremia and hypovolemia. We'll continue metoprolol. Would restart HCTZ once patient is fluid repleted.  BPH Continue tamsulosin and finasteride.    Other information:   DVT prophylaxis: On Eliquis for A. fib Code Status: Full code. Family Communication: Left message for patient's granddaughter who had already left the ED. Disposition Plan: Home Consults called: none Admission status: observation MedSurg   The medical decision making is of moderate complexity, therefore this is a level 2 visit.  Horatio PelSrobona Orma Flamingublu Ruqayyah Lute [facility] Pager [phone] Cell: [phone]   If 7PM-7AM, please contact night-coverage [email or web address] Password [email or web address] 05/28/2017, 4:08 PM

## 2017-05-29 DIAGNOSIS — Z8673 Personal history of transient ischemic attack (TIA), and cerebral infarction without residual deficits: Secondary | ICD-10-CM | POA: Diagnosis not present

## 2017-05-29 DIAGNOSIS — E871 Hypo-osmolality and hyponatremia: Secondary | ICD-10-CM | POA: Diagnosis present

## 2017-05-29 DIAGNOSIS — F0390 Unspecified dementia without behavioral disturbance: Secondary | ICD-10-CM

## 2017-05-29 DIAGNOSIS — I480 Paroxysmal atrial fibrillation: Secondary | ICD-10-CM

## 2017-05-29 DIAGNOSIS — Z79899 Other long term (current) drug therapy: Secondary | ICD-10-CM | POA: Diagnosis not present

## 2017-05-29 DIAGNOSIS — Z96643 Presence of artificial hip joint, bilateral: Secondary | ICD-10-CM | POA: Diagnosis present

## 2017-05-29 DIAGNOSIS — Z7901 Long term (current) use of anticoagulants: Secondary | ICD-10-CM | POA: Diagnosis not present

## 2017-05-29 DIAGNOSIS — N390 Urinary tract infection, site not specified: Secondary | ICD-10-CM | POA: Diagnosis present

## 2017-05-29 DIAGNOSIS — B965 Pseudomonas (aeruginosa) (mallei) (pseudomallei) as the cause of diseases classified elsewhere: Secondary | ICD-10-CM | POA: Diagnosis present

## 2017-05-29 DIAGNOSIS — R531 Weakness: Secondary | ICD-10-CM | POA: Diagnosis not present

## 2017-05-29 DIAGNOSIS — E861 Hypovolemia: Secondary | ICD-10-CM | POA: Diagnosis present

## 2017-05-29 DIAGNOSIS — F329 Major depressive disorder, single episode, unspecified: Secondary | ICD-10-CM | POA: Diagnosis present

## 2017-05-29 DIAGNOSIS — K529 Noninfective gastroenteritis and colitis, unspecified: Secondary | ICD-10-CM

## 2017-05-29 DIAGNOSIS — J449 Chronic obstructive pulmonary disease, unspecified: Secondary | ICD-10-CM | POA: Diagnosis present

## 2017-05-29 DIAGNOSIS — Z87891 Personal history of nicotine dependence: Secondary | ICD-10-CM | POA: Diagnosis not present

## 2017-05-29 DIAGNOSIS — I10 Essential (primary) hypertension: Secondary | ICD-10-CM | POA: Diagnosis present

## 2017-05-29 DIAGNOSIS — D649 Anemia, unspecified: Secondary | ICD-10-CM | POA: Diagnosis present

## 2017-05-29 DIAGNOSIS — N400 Enlarged prostate without lower urinary tract symptoms: Secondary | ICD-10-CM | POA: Diagnosis present

## 2017-05-29 LAB — CBC
HCT: 29.2 % — ABNORMAL LOW (ref 39.0–52.0)
HEMOGLOBIN: 10 g/dL — AB (ref 13.0–17.0)
MCH: 31.2 pg (ref 26.0–34.0)
MCHC: 34.2 g/dL (ref 30.0–36.0)
MCV: 91 fL (ref 78.0–100.0)
PLATELETS: 256 10*3/uL (ref 150–400)
RBC: 3.21 MIL/uL — ABNORMAL LOW (ref 4.22–5.81)
RDW: 14.8 % (ref 11.5–15.5)
WBC: 7.7 10*3/uL (ref 4.0–10.5)

## 2017-05-29 LAB — BASIC METABOLIC PANEL
ANION GAP: 7 (ref 5–15)
BUN: 9 mg/dL (ref 6–20)
CALCIUM: 8.2 mg/dL — AB (ref 8.9–10.3)
CO2: 26 mmol/L (ref 22–32)
CREATININE: 0.75 mg/dL (ref 0.61–1.24)
Chloride: 100 mmol/L — ABNORMAL LOW (ref 101–111)
GFR calc Af Amer: >60 mL/min (ref >60)
GFR calc non Af Amer: >60 mL/min (ref >60)
GLUCOSE: 97 mg/dL (ref 65–99)
Potassium: 3.7 mmol/L (ref 3.5–5.1)
Sodium: 133 mmol/L — ABNORMAL LOW (ref 135–145)

## 2017-05-29 NOTE — Progress Notes (Signed)
[facility] PROGRESS NOTE  David [name] ZOX:[id number] DOB: [birth year] DOA: 05/28/2017  PCP: System, Pcp Not In  Brief History/Interval Summary: 82 year old Caucasian male with a past medical history of dementia, paroxysmal atrial fibrillation on anticoagulation, which was started recently, history of COPD, history of stroke, recent hospitalization for C. difficile colitis was brought in by his granddaughter for generalized weakness. He finished his course of vancomycin and Flagyl. 4 days ago, but for the past 2 days he's been weaker than usual.  Reason for Visit: Generalized weakness. UTI.  Consultants: None  Procedures: None  Antibiotics: Ceftazidime  Subjective/Interval History: Patient has dementia. Limited information from him. Denies any pain at this time.  ROS: Unable to do due to his dementia  Objective:  Vital Signs  Vitals:   05/28/17 1530 05/28/17 1602 05/28/17 2239 05/29/17 0455  BP: (!) 141/78 (!) 157/135 134/67 (!) 151/71  Pulse: 63 64 63 68  Resp: 17 17 18 19   Temp:  (!) 97.3 F (36.3 C) 97.6 F (36.4 C) 97.8 F (36.6 C)  TempSrc:  Oral Oral Oral  SpO2: 93% 95% 97% 97%  Weight:      Height:        Intake/Output Summary (Last 24 hours) at 05/29/17 1301 Last data filed at 05/29/17 1016  Gross per 24 hour  Intake             2730 ml  Output              700 ml  Net             2030 ml   Filed Weights   05/28/17 1057  Weight: 69.9 kg (154 lb)    General appearance: alert, cooperative, appears stated age, distracted and no distress Resp: clear to auscultation bilaterally Cardio: regular rate and rhythm, S1, S2 normal, no murmur, click, rub or gallop GI: soft, non-tender; bowel sounds normal; no masses,  no organomegaly Extremities: extremities normal, atraumatic, no cyanosis or edema Neurologic: No focal deficits. He is awake, alert, but confused at times. This probably is his baseline.  Lab Results:  Data Reviewed: I have  personally reviewed following labs and imaging studies  CBC:  Recent Labs Lab 05/28/17 1322 05/29/17 0548  WBC 13.2* 7.7  NEUTROABS 9.7*  --   HGB 10.3* 10.0*  HCT 30.5* 29.2*  MCV 88.9 91.0  PLT 260 256    Basic Metabolic Panel:  Recent Labs Lab 05/28/17 1322 05/29/17 0548  NA 131* 133*  K 4.1 3.7  CL 96* 100*  CO2 30 26  GLUCOSE 108* 97  BUN 12 9  CREATININE 0.99 0.75  CALCIUM 8.6* 8.2*  MG 1.7  --     GFR: Estimated Creatinine Clearance: 66.5 mL/min (by C-G formula based on SCr of 0.75 mg/dL).  Liver Function Tests:  Recent Labs Lab 05/28/17 1322  AST 31  ALT 26  ALKPHOS 45  BILITOT 0.6  PROT 5.6*  ALBUMIN 3.4*     Recent Results (from the past 240 hour(s))  Urine culture     Status: Abnormal   Collection Time: 05/20/17  7:38 PM  Result Value Ref Range Status   Specimen Description URINE, RANDOM  Final   Special Requests NONE  Final   Culture (A)  Final    80,000 COLONIES/mL PSEUDOMONAS AERUGINOSA MULTI-DRUG RESISTANT ORGANISM RESULT CALLED TO, READ BACK BY AND VERIFIED WITH: Dorena BodoM HAMBY,RN AT [id number] 05/25/17 BY L BENFIELD Performed at [facility], [address], [place], [place] [address]    Report Status 05/25/2017 FINAL  Final   Organism ID, Bacteria PSEUDOMONAS AERUGINOSA (A)  Final      Susceptibility   Pseudomonas aeruginosa - MIC*    CEFTAZIDIME 16 INTERMEDIATE Intermediate     CIPROFLOXACIN >=4 RESISTANT Resistant     GENTAMICIN >=16 RESISTANT Resistant     IMIPENEM >=16 RESISTANT Resistant     CEFEPIME INTERMEDIATE Intermediate     * 80,000 COLONIES/mL PSEUDOMONAS AERUGINOSA      Radiology Studies: Dg Chest 2 View  Result Date: 05/28/2017 CLINICAL DATA:  Generalized weakness EXAM: CHEST  2 VIEW COMPARISON:  05/20/2017 FINDINGS: Heart size normal. Negative for heart failure. Atherosclerotic aorta. Negative for infiltrate. Small left effusion. Mild lower thoracic compression fracture is unchanged. IMPRESSION: Small left effusion.  Electronically Signed   By: Marlan Palau M.D.   On: 05/28/2017 11:42     Medications:  Scheduled: . apixaban  2.5 mg Oral BID  . citalopram  20 mg Oral Q breakfast  . feeding supplement (ENSURE ENLIVE)  237 mL Oral QPC lunch  . finasteride  5 mg Oral Q breakfast  . metoprolol tartrate  12.5 mg Oral BID  . tamsulosin  0.4 mg Oral QPC supper  . vancomycin  125 mg Oral Q6H   Continuous: . sodium chloride 125 mL/hr at 05/29/17 0159  . cefTAZidime (FORTAZ)  IV    . cefTAZidime (FORTAZ)  IV Stopped (05/29/17 1041)   UEA:[id number] **OR** acetaminophen, polyethylene glycol  Assessment/Plan:  Active Problems:   Hyponatremia   Dementia   Colitis presumed infectious   Paroxysmal atrial fibrillation (HCC)   Acute lower UTI   UTI (urinary tract infection)    Generalized weakness. Thought to be secondary to urinary tract infection and perhaps poor oral intake. Started on antibiotics, as will be discussed below. He was also hydrated with IV fluids. PT and OT evaluation. Unclear if his generalized weakness is entirely due to UTI. He does have dementia. Per her granddaughter dementia has been slowly getting worse over the last 6 months to ensure. However, his poor oral intake has been present only for the last couple months, during which time. He has had a UTI as well as C. difficile colitis.   Urinary tract infection with Pseudomonas This was detected on urine culture during his last hospitalization, which was earlier this month. Only 80,000 colonies were noted. Intermediate sensitivities to cefepime and ceftazidime. Urine culture from this hospitalization is still pending. Treat with ceftazidime for now. Follow-up on the current urine cultures.  C. difficile colitis. Treated with vancomycin and Flagyl. He completed a course few days ago. According to granddaughter, patient has been having 3-4 loose stools every day. They're still watery and have mucus. It's possible that his infection  is not completely treated yet. Since he was started back on antibacterials he was placed back on oral vancomycin. Continue for now.  Hyponatremia Patient was gently hydrated. Sodium level slightly better this morning. Continue to follow.  History of paroxysmal atrial fibrillation versus MAT Seen by cardiology during his previous hospitalization. This was extensively discussed with family. They opted to treat patient with anticoagulation, which was initiated. Continue metoprolol for rate control. Continue eliquis.  History of essential hypertension. HCTZ is on hold due to hypernatremia. Continue metoprolol. Blood pressure noted to be slightly elevated this morning.  History of BPH. Continue tamsulosin and finasteride.  History of dementia Stable. He is not on any medications for same.  DVT Prophylaxis: He  is anticoagulated    Code Status: Full code  Family Communication: discussed with granddaughter  Disposition Plan: Management as outlined above.    LOS: 0 days   [facility]  [facility] Pager [phone] 05/29/2017, 1:01 PM  If 7PM-7AM, please contact night-coverage at [email or web address], password [facility]

## 2017-05-29 NOTE — Evaluation (Signed)
Physical Therapy Evaluation Patient Details Name: David [name] MRN: [id number] DOB: 07/13/1935 Today's Date: 05/29/2017   History of Present Illness  David [name] is a 82 y.o. male with medical history significant for dementia, chronic dysarthria, COPD, and depression, admitted through emergency department for evaluation of diarrhea with loss of appetite and malaise. positive for C Diff., recent treatment for suspected UTI.  Clinical Impression  Pt admitted as above and presenting with functional mobility limitations 2* generalized weakness, balance deficits and dementia related cognition.  Pt would benefit from follow up HHPT upon dc as long as family can continue to provide level of assist needed.     Follow Up Recommendations Home health PT;Supervision/Assistance - 24 hour    Equipment Recommendations  None recommended by PT    Recommendations for Other Services       Precautions / Restrictions Precautions Precautions: Fall Restrictions Weight Bearing Restrictions: No      Mobility  Bed Mobility Overal bed mobility: Needs Assistance Bed Mobility: Supine to Sit     Supine to sit: Min assist     General bed mobility comments: Increased time.  Assist required to complete trunk to upright sitting  Transfers Overall transfer level: Needs assistance Equipment used: Rolling walker (2 wheeled) Transfers: Sit to/from Stand Sit to Stand: Mod assist;+2 physical assistance         General transfer comment: Mod assist and blocking at feet to bring wt up and fwd and to stabiliize in standing.  Pt with post drift .  Ambulation/Gait Ambulation/Gait assistance: Mod assist;+2 safety/equipment Ambulation Distance (Feet): 16 Feet Assistive device: Rolling walker (2 wheeled) Gait Pattern/deviations: Step-to pattern;Shuffle;Festinating Gait velocity: decr Gait velocity interpretation: Below normal speed for age/gender General Gait Details: very slow pace, small  shuffling steps. Assist with RW with forward progression.  Stairs            Wheelchair Mobility    Modified Rankin (Stroke Patients Only)       Balance Overall balance assessment: Needs assistance Sitting-balance support: No upper extremity supported;Feet supported Sitting balance-Leahy Scale: Fair     Standing balance support: Bilateral upper extremity supported Standing balance-Leahy Scale: Poor                               Pertinent Vitals/Pain Pain Assessment: Faces Faces Pain Scale: Hurts a little bit Pain Location: all over Pain Descriptors / Indicators: Grimacing Pain Intervention(s): Limited activity within patient's tolerance;Monitored during session    Home Living Family/patient expects to be discharged to:: Private residence Living Arrangements: Children;Other relatives Available Help at Discharge: Family             Additional Comments: no family present and patient unable to provide info    Prior Function Level of Independence: Needs assistance   Gait / Transfers Assistance Needed: Pt reports using RW but not walking a lot  ADL's / Homemaking Assistance Needed: assist of family  Comments: pt was walking to bathroom with walker with assistance. Feeds self at baseline. Family helps with adls (extensively) (from earlier hospital stay)     Hand Dominance        Extremity/Trunk Assessment   Upper Extremity Assessment Upper Extremity Assessment: Generalized weakness    Lower Extremity Assessment Lower Extremity Assessment: Generalized weakness    Cervical / Trunk Assessment Cervical / Trunk Assessment: Kyphotic  Communication   Communication: No difficulties  Cognition Arousal/Alertness: Awake/alert Behavior During Therapy: WFL for  tasks assessed/performed Overall Cognitive Status: History of cognitive impairments - at baseline                                 General Comments: patient was cooperative  today      General Comments      Exercises     Assessment/Plan    PT Assessment Patient needs continued PT services  PT Problem List Decreased activity tolerance;Decreased balance;Decreased mobility;Decreased knowledge of precautions;Decreased safety awareness;Decreased knowledge of use of DME;Decreased cognition       PT Treatment Interventions DME instruction;Gait training;Functional mobility training;Therapeutic activities;Patient/family education    PT Goals (Current goals can be found in the Care Plan section)  Acute Rehab PT Goals Patient Stated Goal: No specific goals expressed PT Goal Formulation: Patient unable to participate in goal setting Time For Goal Achievement: 06/12/17 Potential to Achieve Goals: Fair    Frequency Min 3X/week   Barriers to discharge        Co-evaluation               AM-PAC PT "6 Clicks" Daily Activity  Outcome Measure Difficulty turning over in bed (including adjusting bedclothes, sheets and blankets)?: Total Difficulty moving from lying on back to sitting on the side of the bed? : Total Difficulty sitting down on and standing up from a chair with arms (e.g., wheelchair, bedside commode, etc,.)?: Total Help needed moving to and from a bed to chair (including a wheelchair)?: A Lot Help needed walking in hospital room?: A Lot Help needed climbing 3-5 steps with a railing? : A Lot 6 Click Score: 9    End of Session Equipment Utilized During Treatment: Gait belt Activity Tolerance: Patient limited by fatigue Patient left: in chair;with call bell/phone within reach;with chair alarm set;with nursing/sitter in room Nurse Communication: Mobility status PT Visit Diagnosis: Unsteadiness on feet (R26.81);Other abnormalities of gait and mobility (R26.89)    Time: 1350-1419 PT Time Calculation (min) (ACUTE ONLY): 29 min   Charges:   PT Evaluation $PT Eval Moderate Complexity: 1 Mod PT Treatments $Gait Training: 8-22 mins   PT G  Codes:        Pg [phone]   Sharai Overbay 05/29/2017, 2:41 PM

## 2017-05-29 NOTE — Care Management Note (Signed)
Case Management Note  Patient Details  Name: Armida SansRobert E Hauk MRN: [id number] Date of Birth: [birth year]  Subjective/Objective:                  afebrile patient who was somewhat listless and nontoxic in appearance. He was noted to be somewhat bradycardic at 56 although blood pressure was within normal limits. White count was elevated at 13 and his sodium was down to 131 decreased from 137 at last admission. Patient was thought to be dehydrated likely secondary to infection and decreased by mouth intake. He was treated with IV fluids and ceftazidime to cover Pseudomonas.  Action/Plan: fyi-patient using the services of [facility] Date:  May 29, 2017 Chart reviewed for concurrent status and case management needs. Will continue to follow patient progress. Discharge Planning: following for needs Expected discharge date: [id number] Marcelle SmilingRhonda Marny Smethers, BSN, [place], [place]   [phone] Expected Discharge Date:   (unknown)               Expected Discharge Plan:  Home w Home Health Services  In-House Referral:  NA  Discharge planning Services  CM Consult  Post Acute Care Choice:  Home Health Choice offered to:  NA  DME Arranged:  N/A DME Agency:  Other - Comment  HH Arranged:  RN HH Agency:  [facility]  Status of Service:  In process, will continue to follow  If discussed at Long Length of Stay Meetings, dates discussed:    Additional Comments:  Golda AcreDavis, Arshawn Valdez Lynn, RN 05/29/2017, 8:53 AM

## 2017-05-30 LAB — BASIC METABOLIC PANEL
Anion gap: 6 (ref 5–15)
BUN: 7 mg/dL (ref 6–20)
CHLORIDE: 101 mmol/L (ref 101–111)
CO2: 27 mmol/L (ref 22–32)
CREATININE: 0.64 mg/dL (ref 0.61–1.24)
Calcium: 8.1 mg/dL — ABNORMAL LOW (ref 8.9–10.3)
GFR calc Af Amer: >60 mL/min (ref >60)
Glucose, Bld: 93 mg/dL (ref 65–99)
Potassium: 3.7 mmol/L (ref 3.5–5.1)
SODIUM: 134 mmol/L — AB (ref 135–145)

## 2017-05-30 LAB — CBC
HCT: 29.2 % — ABNORMAL LOW (ref 39.0–52.0)
Hemoglobin: 10 g/dL — ABNORMAL LOW (ref 13.0–17.0)
MCH: 30.6 pg (ref 26.0–34.0)
MCHC: 34.2 g/dL (ref 30.0–36.0)
MCV: 89.3 fL (ref 78.0–100.0)
PLATELETS: 272 10*3/uL (ref 150–400)
RBC: 3.27 MIL/uL — ABNORMAL LOW (ref 4.22–5.81)
RDW: 14.4 % (ref 11.5–15.5)
WBC: 6.3 10*3/uL (ref 4.0–10.5)

## 2017-05-30 LAB — URINE CULTURE

## 2017-05-30 MED ORDER — ENSURE ENLIVE PO LIQD
237.0000 mL | Freq: Three times a day (TID) | ORAL | Status: DC
  Administered 2017-05-30 – 2017-06-01 (×7): 237 mL via ORAL

## 2017-05-30 NOTE — Progress Notes (Signed)
Occupational Therapy Evaluation Patient Details Name: SKIPPY MARHEFKA MRN: [id number] DOB: 1935/03/31 Today's Date: 05/30/2017    History of Present Illness MELVYN HOMMES is a 82 y.o. male with medical history significant for dementia, chronic dysarthria, COPD, and depression, admitted through emergency department for evaluation of diarrhea with loss of appetite and malaise. positive for C Diff., recent treatment for suspected UTI.   Clinical Impression   PTA, pt at home with family who was providing care since last hospitalization.  Family states that pt was ambulatory prior to previous hospitalization 2 weeks ago and assisted pt with ADL  Currently pt requires total A +2 with mobility and total A with ADL. Required use of Stedy to mobilize to chair due to pt lifting feet from floor when attempting to stand. Nsg notified to use Maximove to safely transfer pt at this time. Discussion with daughter who was present during session and she stated she wanted her father to go to "rehab" to get stronger so they could eventually care for him at home. Nsg notified. Will continue to follow acutely to address established goals.     Follow Up Recommendations  Supervision/Assistance - 24 hour;SNF    Equipment Recommendations  None recommended by OT    Recommendations for Other Services       Precautions / Restrictions Precautions Precautions: Fall Precaution Comments: c diff.  Restrictions Weight Bearing Restrictions: No      Mobility Bed Mobility Overal bed mobility: Needs Assistance Bed Mobility: Supine to Sit     Supine to sit: Max assist     General bed mobility comments: posterior lean/pushing L  Transfers Overall transfer level: Needs assistance Equipment used:  (stedy. unable to use RW) Transfers: Sit to/from Stand Sit to Stand: +2 physical assistance;Total assist         General transfer comment: Pt tryint to lift feet from floor in standing.    Balance Overall  balance assessment: Needs assistance Sitting-balance support: No upper extremity supported;Feet supported Sitting balance-Leahy Scale: Poor Sitting balance - Comments: posterior and L bias Postural control: Posterior lean Standing balance support: Bilateral upper extremity supported Standing balance-Leahy Scale: Zero                             ADL either performed or assessed with clinical judgement   ADL Overall ADL's : Needs assistance/impaired Eating/Feeding: Maximal assistance;Sitting   Grooming: Moderate assistance   Upper Body Bathing: Moderate assistance;Sitting   Lower Body Bathing: Maximal assistance   Upper Body Dressing : Maximal assistance   Lower Body Dressing: Total assistance   Toilet Transfer: Total assistance;+2 for physical assistance (to bed)   Toileting- Clothing Manipulation and Hygiene: Total assistance Toileting - Clothing Manipulation Details (indicate cue type and reason): incontinenet of urine and BM     Functional mobility during ADLs: Total assistance;+2 for physical assistance General ADL Comments: Used Stedy to assist with standing as pt unable to use RW. Pt attempting to raise feet from floor when standing.     Vision         Perception     Praxis      Pertinent Vitals/Pain Faces Pain Scale: Hurts a little bit Pain Location: all over Pain Descriptors / Indicators: Grimacing     Hand Dominance     Extremity/Trunk Assessment Upper Extremity Assessment Upper Extremity Assessment: Generalized weakness (rigidity noted. Able to reach up to [facility] to assist )   Lower Extremity Assessment  Lower Extremity Assessment: Defer to PT evaluation   Cervical / Trunk Assessment Cervical / Trunk Assessment: Kyphotic   Communication Communication Communication: No difficulties   Cognition Arousal/Alertness: Awake/alert Behavior During Therapy: WFL for tasks assessed/performed Overall Cognitive Status: History of cognitive  impairments - at baseline                                 General Comments: patient was cooperative today   General Comments       Exercises     Shoulder Instructions      Home Living Family/patient expects to be discharged to:: Skilled nursing facility Living Arrangements: Children;Other relatives Available Help at Discharge: Family                   Bathroom Toilet: Standard     Home Equipment: Dan Humphreys - 2 wheels;Toilet riser   Additional Comments: no family present and patient unable to provide info      Prior Functioning/Environment Level of Independence: Needs assistance  Gait / Transfers Assistance Needed: Pt reports using RW but not walking a lot ADL's / Homemaking Assistance Needed: assist of family   Comments: pt was walking to bathroom with walker with assistance. Feeds self at baseline. Family helps with adls (extensively) (from earlier hospital stay)        OT Problem List: Decreased strength;Decreased activity tolerance;Impaired balance (sitting and/or standing);Decreased cognition;Decreased safety awareness;Decreased knowledge of use of DME or AE;Decreased range of motion;Impaired vision/perception;Decreased coordination      OT Treatment/Interventions: Self-care/ADL training;DME and/or AE instruction;Balance training;Patient/family education;Therapeutic activities;Cognitive remediation/compensation;Therapeutic exercise    OT Goals(Current goals can be found in the care plan section) Acute Rehab OT Goals Patient Stated Goal: per daughter - to return to PLOF OT Goal Formulation: With family Time For Goal Achievement: 06/10/17 Potential to Achieve Goals: Fair ADL Goals Pt Will Perform Upper Body Bathing: with min assist;sitting Pt Will Perform Lower Body Bathing: with mod assist;sit to/from stand Additional ADL Goal #1: Maintain upright midlein postural control with S x 10 min EOB in preparation for ADL  OT Frequency: Min 2X/week    Barriers to D/C:            Co-evaluation PT/OT/SLP Co-Evaluation/Treatment: Yes            AM-PAC PT "6 Clicks" Daily Activity     Outcome Measure Help from another person eating meals?: A Lot Help from another person taking care of personal grooming?: A Lot Help from another person toileting, which includes using toliet, bedpan, or urinal?: Total Help from another person bathing (including washing, rinsing, drying)?: A Lot Help from another person to put on and taking off regular upper body clothing?: A Lot Help from another person to put on and taking off regular lower body clothing?: Total 6 Click Score: 10   End of Session Equipment Utilized During Treatment: Other (comment) Antony Salmon) Nurse Communication: Mobility status;Other (comment) (DC plan change to SNF per family)  Activity Tolerance: Patient tolerated treatment well Patient left: with call bell/phone within reach;in chair;with chair alarm set;with family/visitor present  OT Visit Diagnosis: Unsteadiness on feet (R26.81);Muscle weakness (generalized) (M62.81);Other abnormalities of gait and mobility (R26.89);Other symptoms and signs involving cognitive function                Time: 1610-9604 OT Time Calculation (min): 49 min Charges:  OT General Charges $OT Visit: 1 Procedure OT Evaluation $OT Eval Moderate Complexity: 1  Procedure OT Treatments $Self Care/Home Management : 23-37 mins G-Codes:     [facility] LPilary Amram Maya, OT/L  [phone] 05/30/2017  Clever Geraldo,HILLARY 05/30/2017, 2:38 PM

## 2017-05-30 NOTE — Progress Notes (Signed)
[facility] PROGRESS NOTE  David [name] ZOX:[id number] DOB: 1935-09-04 DOA: 05/28/2017  PCP: System, Pcp Not In  Brief History/Interval Summary: 82 year old Caucasian male with a past medical history of dementia, paroxysmal atrial fibrillation on anticoagulation, which was started recently, history of COPD, history of stroke, recent hospitalization for C. difficile colitis was brought in by his granddaughter for generalized weakness. He finished his course of vancomycin and Flagyl 4 days ago, but for the past 2 days he's been weaker than usual.  Reason for Visit: Generalized weakness. UTI.  Consultants: None  Procedures: None  Antibiotics: Ceftazidime Oral vancomycin  Subjective/Interval History: Patient has dementia. Unable to provide any information. No concerns brought up by the nursing staff.  ROS: Unable to do due to his dementia  Objective:  Vital Signs  Vitals:   05/29/17 1337 05/29/17 1411 05/29/17 2232 05/30/17 0702  BP: 130/65 126/62 (!) 149/77 135/67  Pulse: (!) 59 67 65 66  Resp: 18  19 18   Temp: 98 F (36.7 C)  97.8 F (36.6 C) 97.9 F (36.6 C)  TempSrc: Oral  Oral Oral  SpO2: 98% 98% 96% 96%  Weight:      Height:        Intake/Output Summary (Last 24 hours) at 05/30/17 0947 Last data filed at 05/30/17 0830  Gross per 24 hour  Intake          2584.58 ml  Output             2200 ml  Net           384.58 ml   Filed Weights   05/28/17 1057  Weight: 69.9 kg (154 lb)    General appearance: Awake, alert. In no distress. Distracted. Resp: Clear to auscultation bilaterally. Cardio: S1, S2 is normal, regular. No S3, S4. No rubs, murmurs, bruits GI: Abdomen is soft. Nontender, nondistended. Bowel sounds are present. No masses or organomegaly Extremities: No edema Neurologic: Awake, alert. Disoriented. No focal deficits  Lab Results:  Data Reviewed: I have personally reviewed following labs and imaging studies  CBC:  Recent Labs Lab  05/28/17 1322 05/29/17 0548 05/30/17 0615  WBC 13.2* 7.7 6.3  NEUTROABS 9.7*  --   --   HGB 10.3* 10.0* 10.0*  HCT 30.5* 29.2* 29.2*  MCV 88.9 91.0 89.3  PLT 260 256 272    Basic Metabolic Panel:  Recent Labs Lab 05/28/17 1322 05/29/17 0548 05/30/17 0615  NA 131* 133* 134*  K 4.1 3.7 3.7  CL 96* 100* 101  CO2 30 26 27   GLUCOSE 108* 97 93  BUN 12 9 7   CREATININE 0.99 0.75 0.64  CALCIUM 8.6* 8.2* 8.1*  MG 1.7  --   --     GFR: Estimated Creatinine Clearance: 66.5 mL/min (by C-G formula based on SCr of 0.64 mg/dL).  Liver Function Tests:  Recent Labs Lab 05/28/17 1322  AST 31  ALT 26  ALKPHOS 45  BILITOT 0.6  PROT 5.6*  ALBUMIN 3.4*     Recent Results (from the past 240 hour(s))  Urine culture     Status: Abnormal   Collection Time: 05/20/17  7:38 PM  Result Value Ref Range Status   Specimen Description URINE, RANDOM  Final   Special Requests NONE  Final   Culture (A)  Final    80,000 COLONIES/mL PSEUDOMONAS AERUGINOSA MULTI-DRUG RESISTANT ORGANISM RESULT CALLED TO, READ BACK BY AND VERIFIED WITH: Dorena Bodo AT 8119 05/25/17 BY L BENFIELD Performed at [facility]  Lab, [address] [place], [place] [address]    Report Status 05/25/2017 FINAL  Final   Organism ID, Bacteria PSEUDOMONAS AERUGINOSA (A)  Final      Susceptibility   Pseudomonas aeruginosa - MIC*    CEFTAZIDIME 16 INTERMEDIATE Intermediate     CIPROFLOXACIN >=4 RESISTANT Resistant     GENTAMICIN >=16 RESISTANT Resistant     IMIPENEM >=16 RESISTANT Resistant     CEFEPIME INTERMEDIATE Intermediate     * 80,000 COLONIES/mL PSEUDOMONAS AERUGINOSA  Urine Culture     Status: Abnormal   Collection Time: 05/28/17  6:11 PM  Result Value Ref Range Status   Specimen Description URINE, RANDOM  Final   Special Requests NONE  Final   Culture MULTIPLE SPECIES PRESENT, SUGGEST RECOLLECTION (A)  Final   Report Status 05/30/2017 FINAL  Final      Radiology Studies: Dg Chest 2 View  Result  Date: 05/28/2017 CLINICAL DATA:  Generalized weakness EXAM: CHEST  2 VIEW COMPARISON:  05/20/2017 FINDINGS: Heart size normal. Negative for heart failure. Atherosclerotic aorta. Negative for infiltrate. Small left effusion. Mild lower thoracic compression fracture is unchanged. IMPRESSION: Small left effusion. Electronically Signed   By: Marlan Palau M.D.   On: 05/28/2017 11:42     Medications:  Scheduled: . apixaban  2.5 mg Oral BID  . citalopram  20 mg Oral Q breakfast  . feeding supplement (ENSURE ENLIVE)  237 mL Oral QPC lunch  . finasteride  5 mg Oral Q breakfast  . metoprolol tartrate  12.5 mg Oral BID  . tamsulosin  0.4 mg Oral QPC supper  . vancomycin  125 mg Oral Q6H   Continuous: . sodium chloride 75 mL/hr at 05/29/17 2225  . cefTAZidime (FORTAZ)  IV    . cefTAZidime (FORTAZ)  IV Stopped (05/30/17 0244)   UEA:[id number] **OR** acetaminophen, polyethylene glycol  Assessment/Plan:  Active Problems:   Hyponatremia   Dementia   Colitis presumed infectious   Paroxysmal atrial fibrillation (HCC)   Acute lower UTI   UTI (urinary tract infection)   Pseudomonas urinary tract infection    Generalized weakness. Thought to be secondary to urinary tract infection and perhaps poor oral intake. Started on antibiotics, as discussed below. He was also hydrated with IV fluids. Unclear if his generalized weakness is entirely due to UTI. He does have dementia. Per granddaughter dementia has been slowly getting worse over the last 6 months to ensure. However, his poor oral intake has been present only for the last couple months, during which time he has had a UTI as well as C. difficile colitis. Seen by physical therapy who recommends home health.  Urinary tract infection with Pseudomonas This was detected on urine culture during his last hospitalization, which was earlier this month. Only 80,000 colonies were noted. Intermediate sensitivities to cefepime and ceftazidime. Urine culture  from this hospitalization is going multiple species. Continue ceftazidime for 1 more day.   C. difficile colitis. He was treated with vancomycin and Flagyl. He completed a course few days ago. According to granddaughter, patient has been having 3-4 loose stools every day. They're still watery and have mucus. It's possible that his infection is not completely treated yet. Since he was started back on antibacterials he was placed back on oral vancomycin. Continue for now.  Hyponatremia Patient was gently hydrated. Sodium level slightly has improved. Continue to follow. Cut back on IV fluids.  History of paroxysmal atrial fibrillation versus MAT Seen by cardiology during his previous hospitalization. This  was extensively discussed with family. They opted to treat patient with anticoagulation, which was initiated. Continue metoprolol for rate control. Continue eliquis.  History of essential hypertension. HCTZ is on hold due to hypernatremia. Continue metoprolol. Blood pressure is reasonably well controlled.  History of BPH. Continue tamsulosin and finasteride.  History of dementia Stable. He is not on any medications for same.  DVT Prophylaxis: He is anticoagulated    Code Status: Full code  Family Communication: discussed with granddaughter  Disposition Plan: Management as outlined above.    LOS: 1 day   [facility] LL[facility]RISHNAN,Karrina Lye  [facility] Pager [phone] 05/30/2017, 9:47 AM  If 7PM-7AM, please contact night-coverage at [email or web address], password [email or web address]

## 2017-05-30 NOTE — Progress Notes (Signed)
Per pt's daughter, pt should go to SNF for rehab.

## 2017-05-31 DIAGNOSIS — R531 Weakness: Secondary | ICD-10-CM

## 2017-05-31 LAB — BASIC METABOLIC PANEL
ANION GAP: 7 (ref 5–15)
BUN: 10 mg/dL (ref 6–20)
CHLORIDE: 99 mmol/L — AB (ref 101–111)
CO2: 27 mmol/L (ref 22–32)
Calcium: 8.3 mg/dL — ABNORMAL LOW (ref 8.9–10.3)
Creatinine, Ser: 0.63 mg/dL (ref 0.61–1.24)
GFR calc Af Amer: >60 mL/min (ref >60)
GFR calc non Af Amer: >60 mL/min (ref >60)
GLUCOSE: 99 mg/dL (ref 65–99)
POTASSIUM: 3.8 mmol/L (ref 3.5–5.1)
SODIUM: 133 mmol/L — AB (ref 135–145)

## 2017-05-31 LAB — CBC
HCT: 28.9 % — ABNORMAL LOW (ref 39.0–52.0)
HEMOGLOBIN: 10 g/dL — AB (ref 13.0–17.0)
MCH: 30.3 pg (ref 26.0–34.0)
MCHC: 34.6 g/dL (ref 30.0–36.0)
MCV: 87.6 fL (ref 78.0–100.0)
Platelets: 268 10*3/uL (ref 150–400)
RBC: 3.3 MIL/uL — AB (ref 4.22–5.81)
RDW: 14 % (ref 11.5–15.5)
WBC: 6.4 10*3/uL (ref 4.0–10.5)

## 2017-05-31 MED ORDER — CEFPODOXIME PROXETIL 200 MG PO TABS
200.0000 mg | ORAL_TABLET | Freq: Two times a day (BID) | ORAL | Status: DC
  Administered 2017-06-01: 200 mg via ORAL
  Filled 2017-05-31: qty 1

## 2017-05-31 NOTE — Clinical Social Work Note (Signed)
Clinical Social Work Assessment  Patient Details  Name: David [name] MRN: [id number] Date of Birth: 1935-06-16  Date of referral:  05/31/17               Reason for consult:  Facility Placement                Permission sought to share information with:  Family Supports Permission granted to share information::  No (pt sleeping)  Name::     [name]  Agency::     Relationship::     Contact Information:     Housing/Transportation Living arrangements for the past 2 months:  Single Family Home Source of Information:   (granddaughter) Patient Interpreter Needed:  None Criminal Activity/Legal Involvement Pertinent to Current Situation/Hospitalization:  No - Comment as needed Significant Relationships:  Adult Children, Other Family Members Lives with:  Relatives (granddaughter) Do you feel safe going back to the place where you live?  Yes Need for family participation in patient care:  Yes (Comment) (pt with dementia- family primary decision makers)  Care giving concerns:  Pt from home where he resides with Family (granddaughter). Reports pt was DC home from [facility] 05/16/17 and at that time SNF was recommended for ST rehab however family felt they could manage at home and opted for home health. Granddaughter states pt had been receiving 2x week PT at home prior to this readmission.     Social Worker assessment / plan:  CSW consulted for SNF placement. Reviewed recommendations (PT recommending HHPT, OT recommending 24/7 supervision/SNF). No other skilled needs identified at this time. Discussed with granddaughter that insurance would not authorize SNF without skilled need identified by PT. Explained private pay SNF could be pursues but granddaughter not prepared for that financially. Granddaughter states family will discuss their plans and if no skilled need identified prior to DC, will need additional assistance at home (bedside commode, possible lift,  etc.) Updated attending.   Plan: TBD- cannot pursue SNF at this time. Granddaughter tentatively planning for pt to return home but hoping recommendation will change if pt does not progress prior to DC.   Employment status:  Retired Health and safety inspectornsurance information:  [employer] PT Recommendations:  24 Hour Supervision, Home with Home Health Information / Referral to community resources:     Patient/Family's Response to care:  Appreciative of care  Patient/Family's Understanding of and Emotional Response to Diagnosis, Current Treatment, and Prognosis:  Granddaughter demonstrates adequate understanding of barriers to SNF and reasons for recommendations. Is hopeful pt will qualify for SNF but understands reasons SNF cannot be pursued based on current needs.   Emotional Assessment Appearance:  Appears stated age Attitude/Demeanor/Rapport:  Unable to Assess Affect (typically observed):  Unable to Assess (sleeping) Orientation:   (UTA- sleeping) Alcohol / Substance use:  Not Applicable Psych involvement (Current and /or in the community):  No (Comment)  Discharge Needs  Concerns to be addressed:  Discharge Planning Concerns Readmission within the last 30 days:  Yes Current discharge risk:  None Barriers to Discharge:  Continued Medical Work up   [employer] CorporationMeghan R Nejla Reasor, LCSW 05/31/2017, 11:57 AM  Weekend coverage [phone]

## 2017-05-31 NOTE — Progress Notes (Signed)
Physical Therapy Treatment Patient Details Name: David SansRobert E [name] MRN: [id number] DOB: Mar 10, 1935 Today's Date: 05/31/2017  Pt seen by OT yesterday and SNF recommended for d/c.  Evaluating physical therapist recommended 24/7 assist and HHPT if family could provide current level of care.  Per OT notes, family would like SNF upon d/c and then home.  Pt requiring +2 assist and lift equipment per latest therapy notes so SNF is currently recommended by PT at this time.  PT follow up recommendations have been updated.        Follow Up Recommendations  Supervision/Assistance - 24 hour;SNF     Equipment Recommendations  None recommended by PT     [name],[name] 05/31/2017, 12:51 PM [name], PT, DPT 05/31/2017 Pager: [phone]

## 2017-05-31 NOTE — Progress Notes (Signed)
[facility] PROGRESS NOTE  David SansRobert E Moll ZOX:[id number] DOB: December 18, 1934 DOA: 05/28/2017  PCP: System, Pcp Not In  Brief History/Interval Summary: 82 year old Caucasian male with a past medical history of dementia, paroxysmal atrial fibrillation on anticoagulation, which was started recently, history of COPD, history of stroke, recent hospitalization for C. difficile colitis was brought in by his granddaughter for generalized weakness. He finished his course of vancomycin and Flagyl 4 days ago, but for the past 2 days he's been weaker than usual.  Reason for Visit: Generalized weakness. UTI.  Consultants: None  Procedures: None  Antibiotics: Ceftazidime Oral vancomycin  Subjective/Interval History: Patient with dementia. Denies any complaints, but at the same time, unable to provide any specific information.  ROS: Unable to do due to his dementia  Objective:  Vital Signs  Vitals:   05/30/17 0702 05/30/17 1338 05/30/17 2115 05/31/17 0647  BP: 135/67 (!) 106/53 (!) 130/95 131/70  Pulse: 66 (!) 57 (!) 109 65  Resp: 18 18 16 16   Temp: 97.9 F (36.6 C) 97.8 F (36.6 C) 97.7 F (36.5 C) 97.8 F (36.6 C)  TempSrc: Oral Oral Oral Oral  SpO2: 96% 97% 96% 96%  Weight:      Height:        Intake/Output Summary (Last 24 hours) at 05/31/17 0909 Last data filed at 05/31/17 0500  Gross per 24 hour  Intake              760 ml  Output             1300 ml  Net             -540 ml   Filed Weights   05/28/17 1057  Weight: 69.9 kg (154 lb)    General appearance: Awake and alert. Distracted. No distress. Resp: Clear to auscultation bilaterally Cardio: S1, S2 is normal, regular. No S3, S4. No rubs, murmurs or bruits GI: Abdomen is soft. Nontender, nondistended. Bowel sounds are present. No masses or organomegaly. Extremities: No edema Neurologic: Awake and alert. Disoriented. No focal neurological deficits.  Lab Results:  Data Reviewed: I have personally reviewed  following labs and imaging studies  CBC:  Recent Labs Lab 05/28/17 1322 05/29/17 0548 05/30/17 0615 05/31/17 0615  WBC 13.2* 7.7 6.3 6.4  NEUTROABS 9.7*  --   --   --   HGB 10.3* 10.0* 10.0* 10.0*  HCT 30.5* 29.2* 29.2* 28.9*  MCV 88.9 91.0 89.3 87.6  PLT 260 256 272 268    Basic Metabolic Panel:  Recent Labs Lab 05/28/17 1322 05/29/17 0548 05/30/17 0615 05/31/17 0615  NA 131* 133* 134* 133*  K 4.1 3.7 3.7 3.8  CL 96* 100* 101 99*  CO2 30 26 27 27   GLUCOSE 108* 97 93 99  BUN 12 9 7 10   CREATININE 0.99 0.75 0.64 0.63  CALCIUM 8.6* 8.2* 8.1* 8.3*  MG 1.7  --   --   --     GFR: Estimated Creatinine Clearance: 66.5 mL/min (by C-G formula based on SCr of 0.63 mg/dL).  Liver Function Tests:  Recent Labs Lab 05/28/17 1322  AST 31  ALT 26  ALKPHOS 45  BILITOT 0.6  PROT 5.6*  ALBUMIN 3.4*     Recent Results (from the past 240 hour(s))  Urine Culture     Status: Abnormal   Collection Time: 05/28/17  6:11 PM  Result Value Ref Range Status   Specimen Description URINE, RANDOM  Final   Special Requests NONE  Final  Culture MULTIPLE SPECIES PRESENT, SUGGEST RECOLLECTION (A)  Final   Report Status 05/30/2017 FINAL  Final      Radiology Studies: No results found.   Medications:  Scheduled: . apixaban  2.5 mg Oral BID  . citalopram  20 mg Oral Q breakfast  . feeding supplement (ENSURE ENLIVE)  237 mL Oral TID BM  . finasteride  5 mg Oral Q breakfast  . metoprolol tartrate  12.5 mg Oral BID  . tamsulosin  0.4 mg Oral QPC supper  . vancomycin  125 mg Oral Q6H   Continuous: . sodium chloride 10 mL/hr at 05/30/17 1300  . cefTAZidime (FORTAZ)  IV    . cefTAZidime (FORTAZ)  IV Stopped (05/31/17 0414)   ZOX:[id number] **OR** acetaminophen, polyethylene glycol  Assessment/Plan:  Active Problems:   Hyponatremia   Dementia   Colitis presumed infectious   Paroxysmal atrial fibrillation (HCC)   Acute lower UTI   UTI (urinary tract infection)    Pseudomonas urinary tract infection    Generalized weakness. Thought to be secondary to urinary tract infection and perhaps poor oral intake. Started on antibiotics, as discussed below. He was also hydrated with IV fluids. Unclear if his generalized weakness is entirely due to UTI. He does have dementia. Per granddaughter dementia has been slowly getting worse over the last 6 months to ensure. However, his poor oral intake has been present only for the last couple months, during which time he has had a UTI as well as C. difficile colitis. Seen by physical and occupational therapy. They do recommend around-the-clock supervision. He has required assistance from more than one individual for transfers. Discussed in detail with patient's granddaughter. At this time, we will go ahead and consult social worker to look into skilled nursing facility for short-term rehabilitation.  Urinary tract infection with Pseudomonas This was detected on urine culture during his last hospitalization, which was earlier this month. Only 80,000 colonies were noted. Intermediate sensitivities to cefepime and ceftazidime. Urine culture from this hospitalization is going multiple species. Continue ceftazidime till today. Change to [facility] tomorrow to treat for a total of 7 days.  C. difficile colitis. He was treated with vancomycin and Flagyl. He completed a course few days prior to admission. According to granddaughter, patient has been having 3-4 loose stools every day. They had been watery and have mucus. It's possible that his infection may not have been completely treated. Since he was started back on antibacterials he was placed back on oral vancomycin. Continue for now.   Hyponatremia Patient was gently hydrated. Sodium levels improved, stable.  History of paroxysmal atrial fibrillation versus MAT Seen by cardiology during his previous hospitalization. This was extensively discussed with family. They opted to treat  patient with anticoagulation, which was initiated. Continue metoprolol for rate control. Continue eliquis.  History of essential hypertension. HCTZ is on hold due to hyponatremia. Continue metoprolol. Blood pressure is reasonably well controlled. We'll recommend discontinuing hydrochlorothiazide at discharge.  History of BPH. Continue tamsulosin and finasteride.  History of dementia Stable. He is not on any medications for same.  Normocytic anemia. Hemoglobin has been stable. No evidence for overt bleeding.  DVT Prophylaxis: He is anticoagulated    Code Status: Full code  Family Communication: discussed with granddaughter  Disposition Plan: Management as above. Consult Child psychotherapist. Anticipate discharge to skilled nursing facility tomorrow.    LOS: 2 days   [facility]  [facility] Pager [phone] 05/31/2017, 9:09 AM  If 7PM-7AM, please contact night-coverage at [email or web address], password  [email or web address]

## 2017-06-01 MED ORDER — CEFPODOXIME PROXETIL 200 MG PO TABS: 200.0000 mg | ORAL_TABLET | Freq: Two times a day (BID) | ORAL | Status: AC

## 2017-06-01 MED ORDER — ENSURE ENLIVE PO LIQD: 237.0000 mL | Freq: Three times a day (TID) | ORAL | 12 refills | Status: AC

## 2017-06-01 MED ORDER — VANCOMYCIN HCL 125 MG PO CAPS: 125.0000 mg | ORAL_CAPSULE | Freq: Four times a day (QID) | ORAL | Status: AC

## 2017-06-01 NOTE — Progress Notes (Signed)
Physical Therapy Treatment Patient Details Name: David [name] MRN: [id number] DOB: 1935-06-05 Today's Date: 06/01/2017    History of Present Illness David [name] is a 82 y.o. male with medical history significant for dementia, chronic dysarthria, COPD, and depression, admitted through emergency department for evaluation of diarrhea with loss of appetite and malaise. positive for C Diff., recent treatment for suspected UTI.    PT Comments    Assisted OOB to amb to bathroom.  Assisted in bathroom peri care and balance.  Pt cont to have loose watery stools.  Assisted to recliner and positioned to comfort.  Granddaughter Albin Felling entered and was very helpful. Discussed ST Rehab at SNF and plans to return home after that.   Follow Up Recommendations  SNF     Equipment Recommendations  None recommended by PT    Recommendations for Other Services       Precautions / Restrictions Precautions Precautions: Fall Precaution Comments: C Diff Restrictions Weight Bearing Restrictions: No    Mobility  Bed Mobility Overal bed mobility: Needs Assistance Bed Mobility: Supine to Sit     Supine to sit: Max assist     General bed mobility comments: posterior lean/pushing L  Transfers Overall transfer level: Needs assistance Equipment used: None Transfers: Sit to/from [employer] Sit to Stand: Max assist;+2 physical assistance;+2 safety/equipment Stand pivot transfers: Max assist;+2 physical assistance;+2 safety/equipment;From elevated surface       General transfer comment: assisted OBB elevated bed with severe posterior lean.  Also assisted on/off toilet with 75% VC's on proper hand placement and extra assist with stand to sit  Ambulation/Gait Ambulation/Gait assistance: Mod assist;+2 safety/equipment Ambulation Distance (Feet): 16 Feet (8 feet to and from bathroom) Assistive device: Rolling walker (2 wheeled) Gait Pattern/deviations: Step-to  pattern;Shuffle;Festinating Gait velocity: decreased   General Gait Details: very slow pace, small shuffling steps. Assist with RW with forward progression.    Very unsteady gait. HIGH FALL RISK   Stairs            Wheelchair Mobility    Modified Rankin (Stroke Patients Only)       Balance                                            Cognition Arousal/Alertness: Awake/alert Behavior During Therapy: WFL for tasks assessed/performed Overall Cognitive Status: History of cognitive impairments - at baseline                                 General Comments: pleasant and joking around with his granddaughter      Exercises      General Comments        Pertinent Vitals/Pain Pain Assessment: No/denies pain    Home Living                      Prior Function            PT Goals (current goals can now be found in the care plan section) Progress towards PT goals: Progressing toward goals    Frequency    Min 3X/week      PT Plan Current plan remains appropriate;Discharge plan needs to be updated    Co-evaluation              AM-PAC PT "6 Clicks"  Daily Activity  Outcome Measure  Difficulty turning over in bed (including adjusting bedclothes, sheets and blankets)?: Total Difficulty moving from lying on back to sitting on the side of the bed? : Total Difficulty sitting down on and standing up from a chair with arms (e.g., wheelchair, bedside commode, etc,.)?: Total Help needed moving to and from a bed to chair (including a wheelchair)?: Total Help needed walking in hospital room?: Total Help needed climbing 3-5 steps with a railing? : Total 6 Click Score: 6    End of Session Equipment Utilized During Treatment: Gait belt Activity Tolerance: Patient limited by fatigue Patient left: in chair;with call bell/phone within reach;with chair alarm set;with nursing/sitter in room Nurse Communication: Mobility status PT  Visit Diagnosis: Unsteadiness on feet (R26.81);Other abnormalities of gait and mobility (R26.89)     Time: [id number] PT Time Calculation (min) (ACUTE ONLY): 32 min  Charges:  $Gait Training: 8-22 mins $Therapeutic Activity: 8-22 mins                    G Codes:       Felecia ShellingLori Carlita Whitcomb  PTA [facility]  Acute  Rehab Pager      [phone]

## 2017-06-01 NOTE — Progress Notes (Signed)
[facility].

## 2017-06-01 NOTE — Progress Notes (Addendum)
CSW met with granddaughter Lonn Georgia and discussed SNF options. The Family is agreeable to [facility]. Patient will transport by [facility]. Pt.Granddaughter went to SNF to fill out paperwork.  Facility unable to accommodate at this time.  1:00pm-Family has chose [facility]. CSW confirmed with liaison Juliann Pulse bed is available.  [facility] called for transport. Nurse given number for report.    Kathrin Greathouse, Latanya Presser, MSW Clinical Social Worker 5E and Psychiatric Service Line [phone] 06/01/2017  12:10 PM

## 2017-06-01 NOTE — Discharge Summary (Signed)
[facility]  Physician Discharge Summary   Patient ID: David [name] MRN: [id number] DOB/AGE: 80-Nov-1936 80 y.o.  Admit date: 05/28/2017 Discharge date: 06/01/2017  [facility]  DISCHARGE DIAGNOSES:  Active Problems:   Hyponatremia   Dementia   Colitis presumed infectious   Paroxysmal atrial fibrillation (HCC)   Acute lower UTI   UTI (urinary tract infection)   Pseudomonas urinary tract infection   RECOMMENDATIONS FOR OUTPATIENT FOLLOW UP: 1. CBC and basic metabolic panel in 1 week 2. Consider palliative medicine consultation if the patient does not progress   DISCHARGE CONDITION: fair  Diet recommendation: Dysphagia 1 diet with liquids  Filed Weights   05/28/17 1057  Weight: 69.9 kg (154 lb)    INITIAL HISTORY: 82 year old Caucasian male with a past medical history of dementia, paroxysmal atrial fibrillation on anticoagulation, which was started recently, history of COPD, history of stroke, recent hospitalization for C. difficile colitis was brought in by his granddaughter for generalized weakness.    HOSPITAL COURSE:   Generalized weakness. Thought to be secondary to urinary tract infection and perhaps poor oral intake. Started on antibiotics, as discussed below. He was also hydrated with IV fluids. Unclear if his generalized weakness is entirely due to UTI. He does have dementia. Per granddaughter dementia has been slowly getting worse over the last 6 months to ensure. However, his poor oral intake has been present only for the last couple months, during which time he has had a UTI as well as C. difficile colitis. Seen by physical and occupational therapy. They do recommend around-the-clock supervision. He has required assistance from more than one individual for transfers. Discussed in detail with patient's granddaughter. Patient will benefit from short-term rehabilitation at skilled nursing facility. Social worker consulted. Stable for discharge  today. If patient does not progress as anticipated. Could consider palliative medicine consult at SNF.  Urinary tract infection with Pseudomonas This was detected on urine culture during his last hospitalization, which was earlier this month. Only 80,000 colonies were noted. Intermediate sensitivities to cefepime and ceftazidime. Urine culture from this hospitalization is growing multiple species. Initially he was given ceftazidime. And then he will changed over to [place]. Will be treated for a total of 7 days.   C. difficile colitis. He was treated with vancomycin and Flagyl during previous hospitalization. He completed a course few days prior to admission. According to granddaughter, patient has been having 3-4 loose stools every day. They had been watery and have mucus. It's possible that his infection may not have been completely treated. Since he was started back on antibacterials he was placed back on oral vancomycin. He will need treatment for 14 days after he completes a course of Vantin. Diarrhea appears to have improved.  Hyponatremia Patient was gently hydrated. Sodium levels improved, stable. Discontinue his diuretic.  History of paroxysmal atrial fibrillation versus MAT Seen by cardiology during his previous hospitalization. This was extensively discussed with family. They opted to treat patient with anticoagulation, which was initiated. Continue metoprolol for rate control. Continue eliquis.  History of essential hypertension. Depression, stable. Continue on the metoprolol. Discontinued HCTZ.  History of BPH. Continue tamsulosin and finasteride.  History of dementia Stable. He is not on any medications for same.  Normocytic anemia. Hemoglobin has been stable. No evidence for overt bleeding.  Overall, stable. Okay for discharge to skilled nursing facility today.   PERTINENT LABS:  The results of significant diagnostics from this hospitalization (including imaging,  microbiology, ancillary and laboratory) are  listed below for reference.    Microbiology: Recent Results (from the past 240 hour(s))  Urine Culture     Status: Abnormal   Collection Time: 05/28/17  6:11 PM  Result Value Ref Range Status   Specimen Description URINE, RANDOM  Final   Special Requests NONE  Final   Culture MULTIPLE SPECIES PRESENT, SUGGEST RECOLLECTION (A)  Final   Report Status 05/30/2017 FINAL  Final     Labs: Basic Metabolic Panel:  Recent Labs Lab 05/28/17 1322 05/29/17 0548 05/30/17 0615 05/31/17 0615  NA 131* 133* 134* 133*  K 4.1 3.7 3.7 3.8  CL 96* 100* 101 99*  CO2 30 26 27 27   GLUCOSE 108* 97 93 99  BUN 12 9 7 10   CREATININE 0.99 0.75 0.64 0.63  CALCIUM 8.6* 8.2* 8.1* 8.3*  MG 1.7  --   --   --    Liver Function Tests:  Recent Labs Lab 05/28/17 1322  AST 31  ALT 26  ALKPHOS 45  BILITOT 0.6  PROT 5.6*  ALBUMIN 3.4*   CBC:  Recent Labs Lab 05/28/17 1322 05/29/17 0548 05/30/17 0615 05/31/17 0615  WBC 13.2* 7.7 6.3 6.4  NEUTROABS 9.7*  --   --   --   HGB 10.3* 10.0* 10.0* 10.0*  HCT 30.5* 29.2* 29.2* 28.9*  MCV 88.9 91.0 89.3 87.6  PLT 260 256 272 268   BNP: BNP (last 3 results)  Recent Labs  05/20/17 1938  BNP 204.1*      IMAGING STUDIES Dg Chest 2 View  Result Date: 05/28/2017 CLINICAL DATA:  Generalized weakness EXAM: CHEST  2 VIEW COMPARISON:  05/20/2017 FINDINGS: Heart size normal. Negative for heart failure. Atherosclerotic aorta. Negative for infiltrate. Small left effusion. Mild lower thoracic compression fracture is unchanged. IMPRESSION: Small left effusion. Electronically Signed   By: Marlan Palau M.D.   On: 05/28/2017 11:42   Dg Chest 2 View  Result Date: 05/20/2017 CLINICAL DATA:  Shortness of breath EXAM: CHEST  2 VIEW COMPARISON:  05/14/2017 FINDINGS: Lungs are clear. Small bilateral pleural effusions, best visualized on the lateral view. No pneumothorax. The heart is normal in size.  Calcified mediastinal  lymph nodes. Mild degenerative changes of the visualized thoracolumbar spine. IMPRESSION: Small bilateral pleural effusions. Electronically Signed   By: Charline Bills M.D.   On: 05/20/2017 18:00   Dg Chest 2 View  Result Date: 05/09/2017 CLINICAL DATA:  Sudden onset weakness. EXAM: CHEST  2 VIEW COMPARISON:  04/29/2017 FINDINGS: AP and lateral views of the chest show hyperexpansion with chronic interstitial coarsening. No focal airspace consolidation. No pulmonary edema or pleural effusion. The cardiopericardial silhouette is within normal limits for size. Lower thoracic compression fracture is similar to prior. Telemetry leads overlie the chest. IMPRESSION: Stable.  Emphysema without acute cardiopulmonary findings. Electronically Signed   By: Kennith Center M.D.   On: 05/09/2017 15:58   Ct Head Wo Contrast  Result Date: 05/10/2017 CLINICAL DATA:  Advanced and dementia.  Colitis. EXAM: CT HEAD WITHOUT CONTRAST TECHNIQUE: Contiguous axial images were obtained from the base of the skull through the vertex without intravenous contrast. COMPARISON:  CT 04/29/2017 FINDINGS: Brain: Advanced generalized brain atrophy. Advanced chronic small-vessel ischemic changes of the cerebral hemispheric white matter. Old small vessel infarctions of the basal ganglia and thalami. No sign of acute infarction, mass lesion, hemorrhage, hydrocephalus or extra-axial collection. Vascular: There is atherosclerotic calcification of the major vessels at the base of the brain. Skull: Normal Sinuses/Orbits: Clear/normal Other: None significant  IMPRESSION: No acute or reversible finding by CT. Advanced generalized atrophy with chronic small-vessel ischemic changes throughout the brain. Electronically Signed   By: Paulina Fusi M.D.   On: 05/10/2017 15:04   Ct Abdomen Pelvis W Contrast  Result Date: 05/09/2017 CLINICAL DATA:  Lower abdominal pain and diarrhea today. Recent UTI. EXAM: CT ABDOMEN AND PELVIS WITH CONTRAST TECHNIQUE:  Multidetector CT imaging of the abdomen and pelvis was performed using the standard protocol following bolus administration of intravenous contrast. CONTRAST:  ISOVUE-300 IOPAMIDOL (ISOVUE-300) INJECTION 61% COMPARISON:  None. FINDINGS: Lower chest: Calcified granuloma over the left lower lobe as lung bases otherwise within normal. Small hiatal hernia. Hepatobiliary: Within normal. Pancreas: Oval cystic lesion over the pancreatic tail measuring 1.2 x 2.1 cm. Centered over the main pancreatic duct. Spleen: A few calcified granulomas present. Adrenals/Urinary Tract: Adrenal glands are normal. Kidneys are normal in size without hydronephrosis or nephrolithiasis. No focal renal mass. Visualized portions of the ureters are within normal. The distal ureters are difficult to visualize as is the entire bladder due to streak artifact from adjacent hardware over the hips. Stomach/Bowel: Small hiatal hernia. Stomach and small bowel are otherwise unremarkable. Appendix is within normal. There is mild diffuse edema/wall thickening involving the ascending colon as well as descending and rectosigmoid colon suggesting a colitis of infectious or inflammatory nature. Vascular/Lymphatic: Moderate calcified plaque over the abdominal aorta and iliac arteries. No evidence of adenopathy. Reproductive: Within normal. Other: No significant free fluid. Musculoskeletal: Right total hip arthroplasty. Hardware over the left femur intact. Moderate degenerate changes spine with multiple compression fractures involving T11 and T12 as well as L1, L3 and L5 likely chronic, but age indeterminate. Mild degenerate change of the left hip. IMPRESSION: Mild wall thickening/ submucosal edema involving the ascending and descending/ rectosigmoid colon likely an acute colitis due to infectious or inflammatory nature. 2 cm cystic lesion over the tail of the pancreas centered over the main pancreatic duct. Recommend follow up pre and post contrast  MRI/MRCP or pancreatic protocol CT in 2 years. This recommendation follows ACR consensus guidelines: Management of Incidental Pancreatic Cysts: A White Paper of the ACR Incidental Findings Committee. J Am Coll Radiol 2017;14:911-923. Multiple spinal compression fractures as described likely chronic, although age indeterminate. Aortic Atherosclerosis (ICD10-I70.0). Electronically Signed   By: Elberta Fortis M.D.   On: 05/09/2017 15:50   Dg Chest Port 1 View  Result Date: 05/14/2017 CLINICAL DATA:  82 year old male with shortness of breath. EXAM: PORTABLE CHEST 1 VIEW COMPARISON:  Chest x-ray 05/10/2017. FINDINGS: Lung volumes are low. Bibasilar opacities may reflect areas of atelectasis and/or airspace consolidation. Small left pleural effusion. No evidence of pulmonary edema. Mild interstitial prominence and peribronchial cuffing, similar to prior examinations, likely chronic. Heart size is normal. The patient is rotated to the left on today's exam, resulting in distortion of the mediastinal contours and reduced diagnostic sensitivity and specificity for mediastinal pathology. Atherosclerosis in the thoracic aorta. Numerous calcified mediastinal and bilateral hilar lymph nodes. Interposition of the colon beneath the right hemidiaphragm again noted. IMPRESSION: 1. Low lung volumes with bibasilar areas of atelectasis and/or consolidation and small left pleural effusion. 2. Aortic atherosclerosis. Electronically Signed   By: Trudie Reed M.D.   On: 05/14/2017 09:22   Dg Chest Port 1 View  Result Date: 05/10/2017 CLINICAL DATA:  Increased dyspnea and weakness this morning EXAM: PORTABLE CHEST 1 VIEW COMPARISON:  Portable exam 1113 hours compared to 05/09/2017 FINDINGS: Normal heart size, mediastinal contours, and pulmonary vascularity.  Atherosclerotic calcifications aorta. Emphysematous changes without infiltrate, pleural effusion or pneumothorax. Calcified adenopathy at LEFT hilum. Bones demineralized.  IMPRESSION: Emphysematous and old granulomatous disease changes. No acute abnormalities. Aortic Atherosclerosis (ICD10-I70.0) and Emphysema (ICD10-J43.9). Electronically Signed   By: Ulyses SouthwardMark  Boles M.D.   On: 05/10/2017 11:26    DISCHARGE EXAMINATION: Vitals:   05/31/17 0647 05/31/17 1336 05/31/17 2038 06/01/17 0624  BP: 131/70 122/78 (!) 121/53 124/89  Pulse: 65 64 61 67  Resp: 16 16 16 16   Temp: 97.8 F (36.6 C) 97.8 F (36.6 C) 97.8 F (36.6 C) 97.8 F (36.6 C)  TempSrc: Oral Axillary Oral Oral  SpO2: 96% 96% 97% 94%  Weight:      Height:       General appearance: alert, distracted, appears stated age and no distress Resp: clear to auscultation bilaterally Cardio: regular rate and rhythm, S1, S2 normal, no murmur, click, rub or gallop GI: soft, non-tender; bowel sounds normal; no masses,  no organomegaly Extremities: extremities normal, atraumatic, no cyanosis or edema  DISPOSITION: SNF  Discharge Instructions    Call MD for:  difficulty breathing, headache or visual disturbances    Complete by:  As directed    Call MD for:  extreme fatigue    Complete by:  As directed    Call MD for:  persistant dizziness or light-headedness    Complete by:  As directed    Call MD for:  persistant nausea and vomiting    Complete by:  As directed    Call MD for:  severe uncontrolled pain    Complete by:  As directed    Call MD for:  temperature >100.4    Complete by:  As directed    Discharge instructions    Complete by:  As directed    Please review instructions on the discharge summary.  You were cared for by a hospitalist during your hospital stay. If you have any questions about your discharge medications or the care you received while you were in the hospital after you are discharged, you can call the unit and asked to speak with the hospitalist on call if the hospitalist that took care of you is not available. Once you are discharged, your primary care physician will handle any  further medical issues. Please note that NO REFILLS for any discharge medications will be authorized once you are discharged, as it is imperative that you return to your primary care physician (or establish a relationship with a primary care physician if you do not have one) for your aftercare needs so that they can reassess your need for medications and monitor your lab values. If you do not have a primary care physician, you can call [phone] for a physician referral.   Increase activity slowly    Complete by:  As directed       ALLERGIES: No Known Allergies   Current Discharge Medication List    START taking these medications   Details  cefpodoxime (VANTIN) 200 MG tablet Take 1 tablet (200 mg total) by mouth every 12 (twelve) hours. For 3 more days.    vancomycin (VANCOCIN) 125 MG capsule Take 1 capsule (125 mg total) by mouth 4 (four) times daily. For 17 more days      CONTINUE these medications which have CHANGED   Details  feeding supplement, ENSURE ENLIVE, (ENSURE ENLIVE) LIQD Take 237 mLs by mouth 3 (three) times daily between meals. Qty: 237 mL, Refills: 12      CONTINUE these  medications which have NOT CHANGED   Details  apixaban (ELIQUIS) 2.5 MG TABS tablet Take 1 tablet (2.5 mg total) by mouth 2 (two) times daily. Qty: 60 tablet, Refills: 0    Cholecalciferol (VITAMIN D3) 1000 units CAPS Take 1,000 Units by mouth daily with breakfast.     citalopram (CELEXA) 20 MG tablet Take 20 mg by mouth daily with breakfast.     finasteride (PROSCAR) 5 MG tablet Take 5 mg by mouth daily with breakfast.     gabapentin (NEURONTIN) 300 MG capsule Take 300 mg by mouth at bedtime.     Melatonin 3 MG TABS Take 6 mg by mouth at bedtime.    metoprolol tartrate (LOPRESSOR) 25 MG tablet Take 0.5 tablets (12.5 mg total) by mouth 2 (two) times daily. Qty: 60 tablet, Refills: 0    Multiple Vitamin (MULTIVITAMIN) capsule Take 1 capsule by mouth daily.    tamsulosin (FLOMAX) 0.4 MG CAPS  capsule Take 0.4 mg by mouth daily after supper.       STOP taking these medications     hydrochlorothiazide (HYDRODIURIL) 25 MG tablet      potassium chloride SA (K-DUR,KLOR-CON) 20 MEQ tablet          TOTAL DISCHARGE TIME: 35 mins  [facility]  [facility] Pager [phone]  06/01/2017, 11:21 AM

## 2017-06-01 NOTE — NC FL2 (Signed)
[place] MEDICAID FL2 LEVEL OF CARE SCREENING TOOL     IDENTIFICATION  Patient Name: David [name] Birthdate: 1935-07-12 Sex: male Admission Date (Current Location): 05/28/2017  [facility] and [place] Number:  Producer, television/film/video and Address:  [facility],  [address] [place] [address]      Provider Number: [id number]  Attending Physician Name and Address:  Osvaldo Shipper, MD  Relative Name and Phone Number:       Current Level of Care: Hospital Recommended Level of Care: Skilled Nursing Facility Prior Approval Number:    Date Approved/Denied:   PASRR Number:    Discharge Plan: SNF    Current Diagnoses: Patient Active Problem List   Diagnosis Date Noted  . Pseudomonas urinary tract infection 05/29/2017  . Acute lower UTI 05/28/2017  . UTI (urinary tract infection) 05/28/2017  . Paroxysmal atrial fibrillation (HCC)   . Multifocal atrial tachycardia (HCC)   . Protein-calorie malnutrition, severe 05/12/2017  . Colitis 05/09/2017  . Depression 05/09/2017  . Hyponatremia 05/09/2017  . Normocytic anemia 05/09/2017  . Dementia 05/09/2017  . Pancreatic lesion 05/09/2017  . Colitis presumed infectious 05/09/2017  . Occult blood positive stool 05/09/2017  . Diarrhea of presumed infectious origin     Orientation RESPIRATION BLADDER Height & Weight     Self  O2 Incontinent, External catheter Weight: 154 lb (69.9 kg) Height:  5\' 6"  (167.6 cm)  BEHAVIORAL SYMPTOMS/MOOD NEUROLOGICAL BOWEL NUTRITION STATUS  Other (Comment)   Incontinent Diet (Dys. One)  AMBULATORY STATUS COMMUNICATION OF NEEDS Skin   Extensive Assist Verbally PU Stage and Appropriate Care   PU Stage 2 Dressing:  (Inner Buttocks )                   Personal Care Assistance Level of Assistance  Bathing, Feeding, Dressing Bathing Assistance: Maximum assistance Feeding assistance: Independent Dressing Assistance: Maximum assistance     Functional Limitations Info  Sight,  Hearing, Speech Sight Info: Adequate Hearing Info: Adequate Speech Info: Adequate    SPECIAL CARE FACTORS FREQUENCY  PT (By licensed PT)     PT Frequency: 5X/ WK              Contractures Contractures Info: Not present    Additional Factors Info  Isolation Precautions, Psychotropic, Code Status Code Status Info: Fullcode Allergies Info: No Known Allergies Psychotropic Info: Celexa   Isolation Precautions Info: Enteric Precations      Current Medications (06/01/2017):  This is the current hospital active medication list Current Facility-Administered Medications  Medication Dose Route Frequency Provider Last Rate Last Dose  . 0.9 %  sodium chloride infusion   Intravenous Continuous Osvaldo Shipper, MD 10 mL/hr at 05/31/17 1930    . acetaminophen (TYLENOL) tablet 650 mg  650 mg Oral Q6H PRN Pieter Partridge, MD       Or  . acetaminophen (TYLENOL) suppository 650 mg  650 mg Rectal Q6H PRN Leandro Reasoner Tublu, MD      . apixaban Everlene Balls) tablet 2.5 mg  2.5 mg Oral BID Leandro Reasoner Tublu, MD   2.5 mg at 05/31/17 2235  . cefpodoxime (VANTIN) tablet 200 mg  200 mg Oral Q12H Osvaldo Shipper, MD      . cefTAZidime (FORTAZ) 1 g in dextrose 5 % 50 mL IVPB  1 g Intravenous Once Lavera Guise, MD      . citalopram (CELEXA) tablet 20 mg  20 mg Oral Q breakfast Pieter Partridge, MD   20  mg at 05/31/17 0850  . feeding supplement (ENSURE ENLIVE) (ENSURE ENLIVE) liquid 237 mL  237 mL Oral TID BM Osvaldo ShipperKrishnan, Gokul, MD   237 mL at 05/31/17 2235  . finasteride (PROSCAR) tablet 5 mg  5 mg Oral Q breakfast Leandro Reasonerhatterjee, Srobona Tublu, MD   5 mg at 05/31/17 0850  . metoprolol tartrate (LOPRESSOR) tablet 12.5 mg  12.5 mg Oral BID Leandro Reasonerhatterjee, Srobona Tublu, MD   12.5 mg at 05/31/17 2235  . polyethylene glycol (MIRALAX / GLYCOLAX) packet 17 g  17 g Oral Daily PRN Leandro Reasonerhatterjee, Srobona Tublu, MD      . tamsulosin (FLOMAX) capsule 0.4 mg  0.4 mg Oral QPC supper Leandro Reasonerhatterjee,  Srobona Tublu, MD   0.4 mg at 05/31/17 1759  . vancomycin (VANCOCIN) 50 mg/mL oral solution 125 mg  125 mg Oral Q6H Lavera GuiseLiu, Dana Duo, MD   125 mg at 06/01/17 [id number]     Discharge Medications: Please see discharge summary for a list of discharge medications.  Relevant Imaging Results:  Relevant Lab Results:   Additional Information SS # [id number].  Clearance CootsNicole A Greysin Medlen, LCSW

## 2017-06-01 NOTE — Discharge Instructions (Signed)
Clostridium Difficile Infection   Clostridium difficile (C. difficile or C. diff) infection causes inflammation of the large intestine (colon). This condition can result in damage to the lining of your colon and may lead to another condition called colitis. This infection can be passed from person to person (is contagious).  Follow these instructions at home:  Eating and drinking   · Drink enough fluid to keep your pee (urine) clear or pale yellow.  · Avoid drinking:  ? Milk.  ? Caffeine.  ? Alcohol.  · Follow exact instructions from your doctor about how to get enough fluid in your body (rehydrate).  · Eat small meals often instead of large meals.  Medicines   · Take your antibiotic medicine as told by your doctor. Do not stop taking the antibiotic even if you start to feel better unless your doctor told you to do that.  · Take over-the-counter and prescription medicines only as told by your doctor.  · Do not use medicines to help with watery poop (diarrhea).  General instructions   · Wash your hands fully before you prepare food and after you use the bathroom. Make sure people who live with you also wash their  · hands often.  · Clean the surfaces that you touch. Use a product that contains chlorine bleach.  · Keep all follow-up visits as told by your doctor. This is important.  Contact a doctor if:  · Your symptoms do not get better with treatment.  · Your symptoms get worse with treatment.  · Your symptoms go away and then come back.  · You have a fever.  · You have new symptoms.  Get help right away if:  · You have more pain or tenderness in your belly (abdomen).  · Your poop (stool) is mostly bloody.  · Your poop looks dark black and tarry.  · You cannot eat or drink without throwing up (vomiting).  · You have signs of dehydration, such as:  ? Dark pee, very little pee, or no pee.  ? Cracked lips.  ? Not making tears when you cry.  ? Dry mouth.  ? Sunken eyes.  ? Feeling sleepy.  ? Feeling weak.  ? Feeling  dizzy.  This information is not intended to replace advice given to you by your health care provider. Make sure you discuss any questions you have with your health care provider.  Document Released: 08/03/2009 Document Revised: 03/13/2016 Document Reviewed: 04/09/2015  Elsevier Interactive Patient Education © 2017 [employer]

## 2017-06-01 NOTE — Clinical Social Work Placement (Addendum)
   CLINICAL SOCIAL WORK PLACEMENT  NOTE  Date:  06/01/2017  Patient Details  Name: David SansRobert E Vanhook MRN: [id number] Date of Birth: 1935/04/01  Clinical Social Work is seeking post-discharge placement for this patient at the Skilled  Nursing Facility level of care (*CSW will initial, date and re-position this form in  chart as items are completed):      Patient/family provided with [facility] Clinical Social Work Department's list of facilities offering this level of care within the geographic area requested by the patient (or if unable, by the patient's family).  Yes   Patient/family informed of their freedom to choose among providers that offer the needed level of care, that participate in Medicare, Medicaid or managed care program needed by the patient, have an available bed and are willing to accept the patient.  Yes   Patient/family informed of [facility] ownership interest in [facility] and [facility], as well as of the fact that they are under no obligation to receive care at these facilities.  PASRR submitted to EDS on       PASRR number received on       Existing PASRR number confirmed on 06/01/17     FL2 transmitted to all facilities in geographic area requested by pt/family on       FL2 transmitted to all facilities within larger geographic area on       Patient informed that his/her managed care company has contracts with or will negotiate with certain facilities, including the following:        Yes   Patient/family informed of bed offers received.  [facility]     Physician [facility] and [facility]     [facility].  Patient to be transferred to facility by [facility]     Patient family notified on 06/01/17 of transfer.  Name of family member notified:  Granddaughter Kayla     PHYSICIAN       Additional Comment:     _______________________________________________ Clearance CootsNicole A Genelle Economou, LCSW 06/01/2017, 11:57 AM

## 2017-09-10 ENCOUNTER — Emergency Department (HOSPITAL_COMMUNITY): Payer: Medicare Other

## 2017-09-10 ENCOUNTER — Encounter (HOSPITAL_COMMUNITY): Payer: Self-pay | Admitting: Emergency Medicine

## 2017-09-10 ENCOUNTER — Emergency Department (HOSPITAL_COMMUNITY)
Admission: EM | Admit: 2017-09-10 | Discharge: 2017-09-10 | Disposition: A | Discharge status: Home / Self Care | Payer: Medicare Other | Attending: Emergency Medicine | Admitting: Emergency Medicine

## 2017-09-10 DIAGNOSIS — Z7901 Long term (current) use of anticoagulants: Secondary | ICD-10-CM | POA: Insufficient documentation

## 2017-09-10 DIAGNOSIS — R410 Disorientation, unspecified: Secondary | ICD-10-CM | POA: Diagnosis not present

## 2017-09-10 DIAGNOSIS — Z79899 Other long term (current) drug therapy: Secondary | ICD-10-CM | POA: Insufficient documentation

## 2017-09-10 DIAGNOSIS — S0083XA Contusion of other part of head, initial encounter: Secondary | ICD-10-CM | POA: Diagnosis not present

## 2017-09-10 DIAGNOSIS — W08XXXA Fall from other furniture, initial encounter: Secondary | ICD-10-CM | POA: Insufficient documentation

## 2017-09-10 DIAGNOSIS — Z87891 Personal history of nicotine dependence: Secondary | ICD-10-CM | POA: Insufficient documentation

## 2017-09-10 DIAGNOSIS — Y999 Unspecified external cause status: Secondary | ICD-10-CM | POA: Diagnosis not present

## 2017-09-10 DIAGNOSIS — S60222A Contusion of left hand, initial encounter: Secondary | ICD-10-CM | POA: Diagnosis not present

## 2017-09-10 DIAGNOSIS — Z96643 Presence of artificial hip joint, bilateral: Secondary | ICD-10-CM | POA: Diagnosis not present

## 2017-09-10 DIAGNOSIS — Z8673 Personal history of transient ischemic attack (TIA), and cerebral infarction without residual deficits: Secondary | ICD-10-CM | POA: Diagnosis not present

## 2017-09-10 DIAGNOSIS — Y92009 Unspecified place in unspecified non-institutional (private) residence as the place of occurrence of the external cause: Secondary | ICD-10-CM | POA: Diagnosis not present

## 2017-09-10 DIAGNOSIS — Y9389 Activity, other specified: Secondary | ICD-10-CM | POA: Diagnosis not present

## 2017-09-10 DIAGNOSIS — J449 Chronic obstructive pulmonary disease, unspecified: Secondary | ICD-10-CM | POA: Diagnosis not present

## 2017-09-10 DIAGNOSIS — W19XXXA Unspecified fall, initial encounter: Secondary | ICD-10-CM

## 2017-09-10 NOTE — ED Provider Notes (Signed)
[facility] EMERGENCY DEPARTMENT Provider Note   CSN: [id number] Arrival date & time: 09/10/17  1628     History   Chief Complaint Chief Complaint  Patient presents with  . Fall    on eliquis     HPI BROCK LARMON is a 82 y.o. male.  The history is provided by the patient and the EMS personnel. No language interpreter was used.  Fall     KYUSS HALE is a 82 y.o. male who presents to the Emergency Department complaining of fall.  Level V caveat due to confusion.  He presents via EMS for evaluation of injuries following a fall.  He is coming from home.  He was standing up out of his lifting recliner earlier today and he fell down on his left side.  No known loss of consciousness.  No recent illnesses.  He does take Eliquis.  In the emergency department he complains of pain to his left hand, no additional complaints.  Fall was unwitnessed.  History provided by EMS and the family.  Past Medical History:  Diagnosis Date  . Aspiration into airway   . COPD (chronic obstructive pulmonary disease) (HCC)   . Pneumonia   . Stroke [facility])    TIA's per CT scan    Patient Active Problem List   Diagnosis Date Noted  . Pseudomonas urinary tract infection 05/29/2017  . Acute lower UTI 05/28/2017  . UTI (urinary tract infection) 05/28/2017  . Paroxysmal atrial fibrillation (HCC)   . Multifocal atrial tachycardia (HCC)   . Protein-calorie malnutrition, severe 05/12/2017  . Colitis 05/09/2017  . Depression 05/09/2017  . Hyponatremia 05/09/2017  . Normocytic anemia 05/09/2017  . Dementia 05/09/2017  . Pancreatic lesion 05/09/2017  . Colitis presumed infectious 05/09/2017  . Occult blood positive stool 05/09/2017  . Diarrhea of presumed infectious origin     Past Surgical History:  Procedure Laterality Date  . JOINT REPLACEMENT Bilateral    Hip replacemen       Home Medications    Prior to Admission medications   Medication Sig Start Date End  Date Taking? Authorizing Provider  acetaminophen (TYLENOL) 500 MG tablet Take 1,000 mg by mouth every 6 (six) hours as needed for headache (pain).   Yes [provider]  apixaban (ELIQUIS) 2.5 MG TABS tablet Take 1 tablet (2.5 mg total) by mouth 2 (two) times daily. 05/16/17  Yes Randel Pigg, Dorma Russell, MD  Calcium Carbonate-Vitamin D (CALCIUM-D PO) Take 1 tablet by mouth daily.   Yes [provider]  Cholecalciferol (VITAMIN D3) 1000 units CAPS Take 1,000 Units by mouth daily with breakfast.    Yes [provider]  citalopram (CELEXA) 20 MG tablet Take 20 mg by mouth daily with breakfast.    Yes [provider]  Denosumab (PROLIA [place]) Inject into the skin every 6 (six) months. Last injection October 2018 (administ[facility] at [facility]   Yes [provider]  finasteride (PROSCAR) 5 MG tablet Take 5 mg by mouth daily with breakfast.    Yes [provider]  gabapentin (NEURONTIN) 300 MG capsule Take 300 mg by mouth at bedtime.    Yes [provider]  haloperidol (HALDOL) 1 MG tablet Take 1 mg by mouth every evening.   Yes [provider]  Melatonin 3 MG TABS Take 6 mg by mouth at bedtime.   Yes [provider]  Multiple Vitamin (MULTIVITAMIN WITH MINERALS) TABS tablet Take 1 tablet by mouth daily.   Yes [provider]  feeding supplement, ENSURE ENLIVE, (ENSURE ENLIVE) LIQD Take 237 mLs by mouth 3 (three) times daily between meals. Patient not taking: Reported on 09/10/2017 06/01/17   Osvaldo ShipperKrishnan, Gokul, MD  metoprolol tartrate (LOPRESSOR) 25 MG tablet Take 0.5 tablets (12.5 mg total) by mouth 2 (two) times daily. Patient not taking: Reported on 09/10/2017 05/16/17   Lenox PondsSilva Zapata, Edwin, MD    Family History Family History  Problem Relation Age of Onset  . Hypertension Father     Social History Social History   Tobacco Use  . Smoking status: Former Games developermoker  . Smokeless tobacco: Never Used  Substance Use Topics  .  Alcohol use: No  . Drug use: No     Allergies   Patient has no known allergies.   Review of Systems Review of Systems  All other systems reviewed and are negative.    Physical Exam Updated Vital Signs BP 112/60   Pulse 60   Temp 97.6 F (36.4 C) (Oral)   Resp 18   Ht 5\' 10"  (1.778 m)   Wt 72.6 kg (160 lb)   SpO2 100%   BMI 22.96 kg/m   Physical Exam  Constitutional: He appears well-developed and well-nourished.  HENT:  Head: Normocephalic.  Ecchymosis and swelling to the left forehead with small overlying abrasion  Cardiovascular: Normal rate and regular rhythm.  No murmur heard. Pulmonary/Chest: Effort normal and breath sounds normal. No respiratory distress.  Abdominal: Soft. There is no tenderness. There is no rebound and no guarding.  Musculoskeletal: He exhibits no edema.  Ecchymosis and mild tenderness over the left dorsal hand.  There is mild ecchymosis over the left elbow and left shoulder with no tenderness over the elbow or shoulder.  Able to range the left hand, elbow and shoulder.  Neurological: He is alert.  Disoriented to time.  Mild generalized weakness.  Skin: Skin is warm and dry.  Psychiatric: He has a normal mood and affect. His behavior is normal.  Nursing note and vitals reviewed.    ED Treatments / Results  Labs (all labs ordered are listed, but only abnormal results are displayed) Labs Reviewed - No data to display  EKG  EKG Interpretation None       Radiology Dg Chest 2 View  Result Date: 09/10/2017 CLINICAL DATA:  Fall today. EXAM: CHEST  2 VIEW COMPARISON:  05/28/2017 FINDINGS: The cardiomediastinal contours are unchanged allowing for patient rotation. Aortic arch atherosclerosis. Streaky bibasilar atelectasis. Decreased small left pleural effusion from prior exam. No confluent consolidation or pneumothorax. No acute osseous abnormalities are seen. Chronic compression deformity in the lower thoracic spine. Remote distal right  clavicle fracture. IMPRESSION: No acute abnormality. Aortic Atherosclerosis (ICD10-I70.0). Electronically Signed   By: Rubye OaksMelanie  Ehinger M.D.   On: 09/10/2017 18:09   Ct Head Wo Contrast  Result Date: 09/10/2017 CLINICAL DATA:  C-spine trauma, high clinical risk (NEXUS/CCR) EXAM: CT HEAD WITHOUT CONTRAST CT CERVICAL SPINE WITHOUT CONTRAST TECHNIQUE: Multidetector CT imaging of the head and cervical spine was performed following the standard protocol without intravenous contrast. Multiplanar CT image reconstructions of the cervical spine were also generated. Study is degraded by motion. COMPARISON:  04/29/2017 FINDINGS: CT HEAD FINDINGS Brain: No evidence of acute infarction, hemorrhage, hydrocephalus, extra-axial collection or mass lesion/mass effect. There is ventricular and sulcal enlargement reflecting moderate generalized atrophy. Vascular: No hyperdense vessel or unexpected calcification. Skull: No skull fracture or skull lesion. Sinuses/Orbits: Globes and orbits are grossly unremarkable. Dependent fluid is seen in the left  sphenoid sinus and a posterior left ethmoid air cell. Remaining visualized sinuses and the mastoid air cells are clear. Other: Left frontal scalp hematoma. CT CERVICAL SPINE FINDINGS Alignment: Kyphosis, apex at C5.  No spondylolisthesis. Skull base and vertebrae: No acute fracture. No primary bone lesion or focal pathologic process. Soft tissues and spinal canal: No prevertebral fluid or swelling. No visible canal hematoma. No soft tissue masses or adenopathy. Disc levels: Moderate loss of disc height at C2-C3 and C3-C4. Moderate loss of disc height at C5-C6. Marked loss of disc height at C6-C7 and C7-T1. Facet degenerative changes are noted most evident at C2-C3 and C3-C4. There is spondylotic disc bulging and uncovertebral spurring with varying degrees of neural foraminal narrowing, greatest on the left at C5-C6, moderate to severe. No convincing disc herniation. Bones are  demineralized. Upper chest: No acute findings. Other: None. IMPRESSION: HEAD CT 1. No acute intracranial abnormalities. 2. No skull fracture. 3. Left frontal scalp hematoma. CERVICAL CT 1. No fracture or acute finding. Electronically Signed   By: Amie Portlandavid  Ormond M.D.   On: 09/10/2017 18:30   Ct Cervical Spine Wo Contrast  Result Date: 09/10/2017 CLINICAL DATA:  C-spine trauma, high clinical risk (NEXUS/CCR) EXAM: CT HEAD WITHOUT CONTRAST CT CERVICAL SPINE WITHOUT CONTRAST TECHNIQUE: Multidetector CT imaging of the head and cervical spine was performed following the standard protocol without intravenous contrast. Multiplanar CT image reconstructions of the cervical spine were also generated. Study is degraded by motion. COMPARISON:  04/29/2017 FINDINGS: CT HEAD FINDINGS Brain: No evidence of acute infarction, hemorrhage, hydrocephalus, extra-axial collection or mass lesion/mass effect. There is ventricular and sulcal enlargement reflecting moderate generalized atrophy. Vascular: No hyperdense vessel or unexpected calcification. Skull: No skull fracture or skull lesion. Sinuses/Orbits: Globes and orbits are grossly unremarkable. Dependent fluid is seen in the left sphenoid sinus and a posterior left ethmoid air cell. Remaining visualized sinuses and the mastoid air cells are clear. Other: Left frontal scalp hematoma. CT CERVICAL SPINE FINDINGS Alignment: Kyphosis, apex at C5.  No spondylolisthesis. Skull base and vertebrae: No acute fracture. No primary bone lesion or focal pathologic process. Soft tissues and spinal canal: No prevertebral fluid or swelling. No visible canal hematoma. No soft tissue masses or adenopathy. Disc levels: Moderate loss of disc height at C2-C3 and C3-C4. Moderate loss of disc height at C5-C6. Marked loss of disc height at C6-C7 and C7-T1. Facet degenerative changes are noted most evident at C2-C3 and C3-C4. There is spondylotic disc bulging and uncovertebral spurring with varying degrees  of neural foraminal narrowing, greatest on the left at C5-C6, moderate to severe. No convincing disc herniation. Bones are demineralized. Upper chest: No acute findings. Other: None. IMPRESSION: HEAD CT 1. No acute intracranial abnormalities. 2. No skull fracture. 3. Left frontal scalp hematoma. CERVICAL CT 1. No fracture or acute finding. Electronically Signed   By: Amie Portlandavid  Ormond M.D.   On: 09/10/2017 18:30   Dg Hand Complete Left  Result Date: 09/10/2017 CLINICAL DATA:  Fall today with left hand pain. EXAM: LEFT HAND - COMPLETE 3+ VIEW COMPARISON:  None. FINDINGS: No acute fracture. Volar subluxation of the third digit at the metacarpal phalangeal joint. The bones are under mineralized. Scattered osteoarthritis of the digits, mild for age. Short fifth digit middle phalanx is likely congenital. Punctate soft tissue density about the thenar eminence is likely chronic, no soft tissue air. Soft tissues are otherwise unremarkable. IMPRESSION: Volar subluxation of the third digit at the metacarpal phalangeal joint, of uncertain acuity. This may  be chronic or acute in the setting of injury. Recommend correlation with focal tenderness. No acute fracture. Osteoporosis/osteopenia. Electronically Signed   By: Rubye Oaks M.D.   On: 09/10/2017 18:14    Procedures Procedures (including critical care time)  Medications Ordered in ED Medications - No data to display   Initial Impression / Assessment and Plan / ED Course  I have reviewed the triage vital signs and the nursing notes.  Pertinent labs & imaging results that were available during my care of the patient were reviewed by me and considered in my medical decision making (see chart for details).     Patient with history of dementia here for evaluation of injuries following a fall out of his recliner.  He does have a scalp hematoma on examination.  CT head and C-spine are negative for acute disease process.  He does have ecchymosis over the  dorsal left hand with minimal tenderness, plain films of the hands demonstrate subluxation of the third digits at the metacarpal phalangeal joint.  He is not tender in this area and he is able to range his hand without difficulty, fever this is a chronic finding.  Discussed with family home care following fall with hand and face contusion.  Discussed outpatient follow-up and return precautions.  Final Clinical Impressions(s) / ED Diagnoses   Final diagnoses:  Fall, initial encounter  Contusion of face, initial encounter  Contusion of left hand, initial encounter    ED Discharge Orders    None       Tilden Fossa, MD 09/11/17 0004

## 2017-09-10 NOTE — ED Triage Notes (Signed)
Per EMS: pt is from home where he leaves with his gdaughter. Pt was standing up from his lifting recliner w/out assistance and fell. Pt was found on his L side. Pt has hematoma on L forehead, skin tear on R hand, and multiple bruises on his R side. Pt is taking eliquis. Family on their way to the hospital.

## 2017-12-11 IMAGING — CT CT CERVICAL SPINE W/O CM
3 of 10 series · 11 of 33 positions shown, 12 images · non-contrast
Comparison: 04/29/2017

CLINICAL DATA: C-spine trauma, high clinical risk (NEXUS/CCR)

EXAM:
CT HEAD WITHOUT CONTRAST
CT CERVICAL SPINE WITHOUT CONTRAST
TECHNIQUE: Multidetector CT imaging of the head and cervical spine was
performed following the standard protocol without intravenous
contrast. Multiplanar CT image reconstructions of the cervical spine
were also generated.
Study is degraded by motion.

[Series 7: coronal · coronal · 0.35mm/px · 3 of 74 slices shown]
[im 19/74  bone]
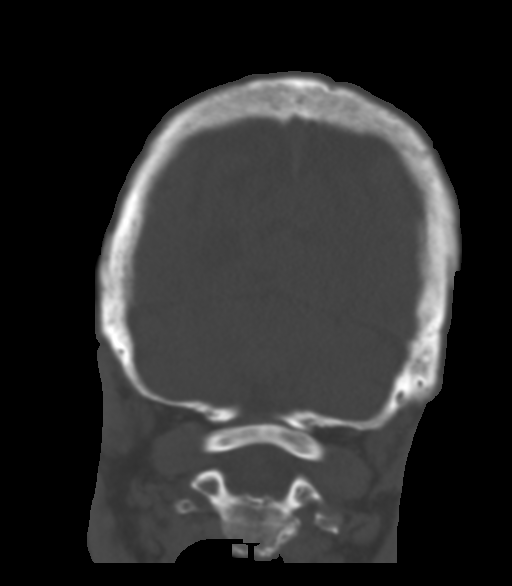
[im 37/74  bone]
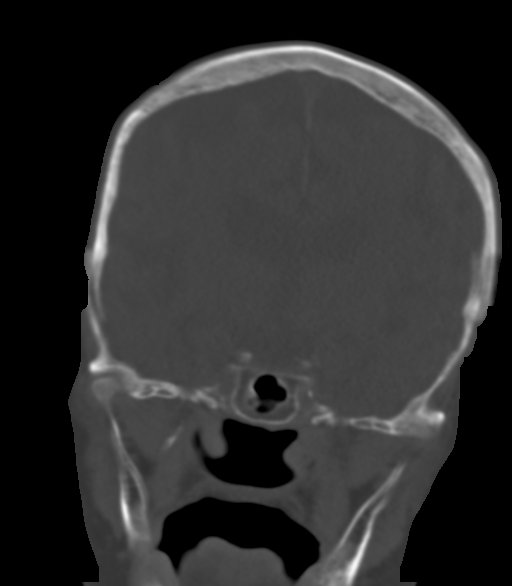
[im 55/74  bone]
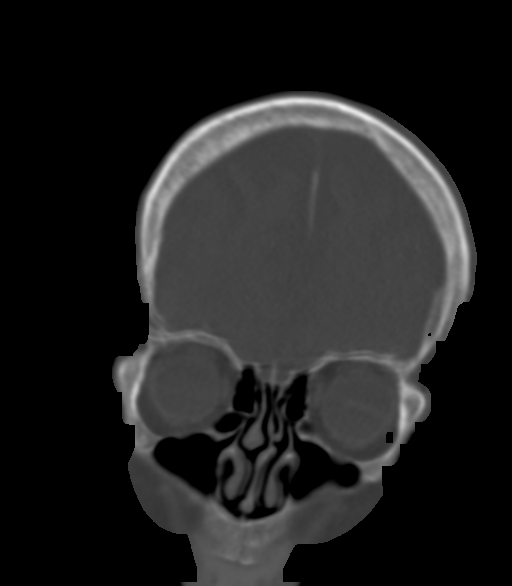

[Series 8: sagittal · sagittal · 0.40mm/px · 5 of 62 slices shown]
[im 11/62  bone]
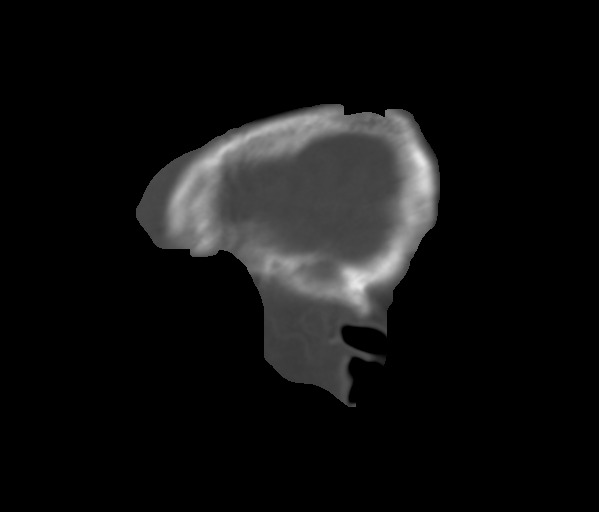
[im 21/62  bone]
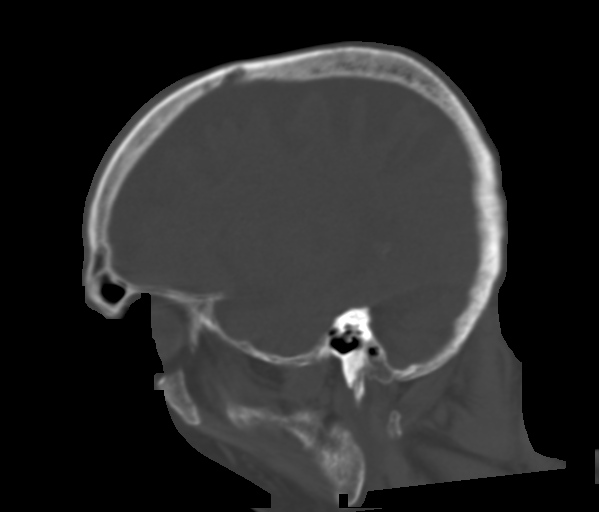
[im 31/62  bone]
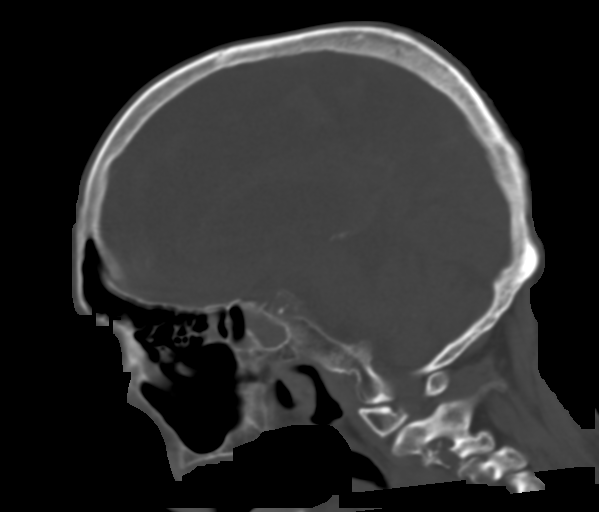
[im 41/62  bone]
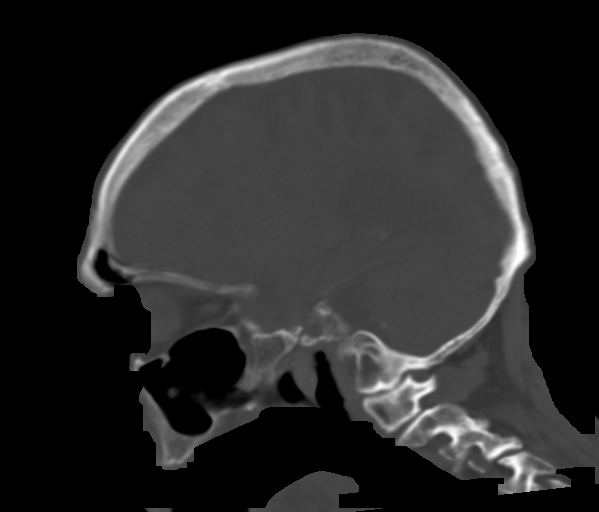
[im 51/62  bone]
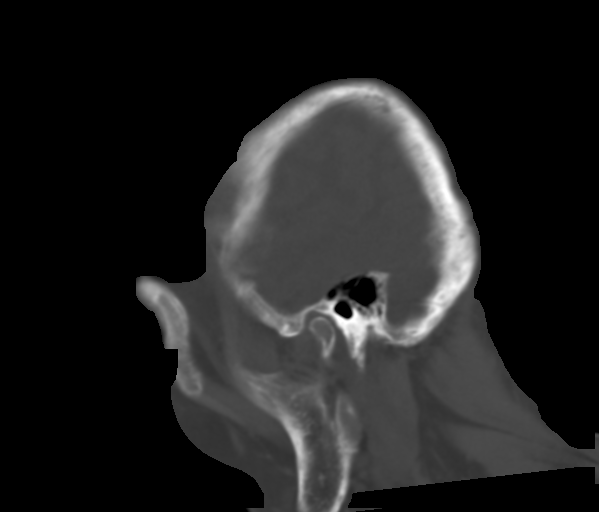

[Series 15: c_spine 2.0 orthogonals · axial · 0.21mm/px · z∈[+1302,+1422]mm · 3 of 87 slices shown, 4 images]
[im 1/87  soft-tissue]
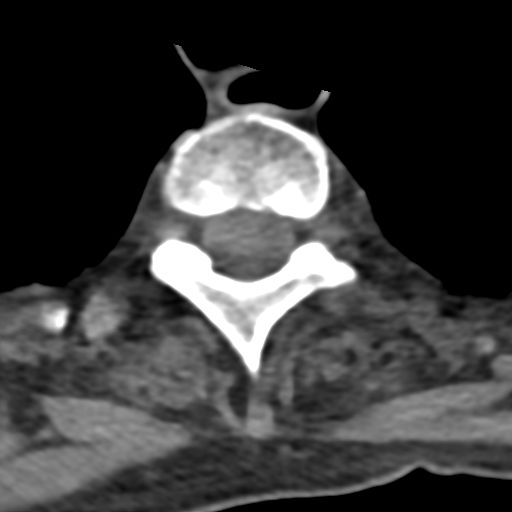
[im 1/87  bone]
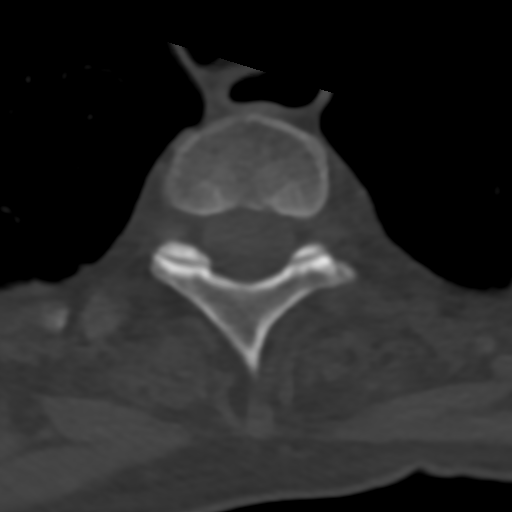
[im 44/87  bone]
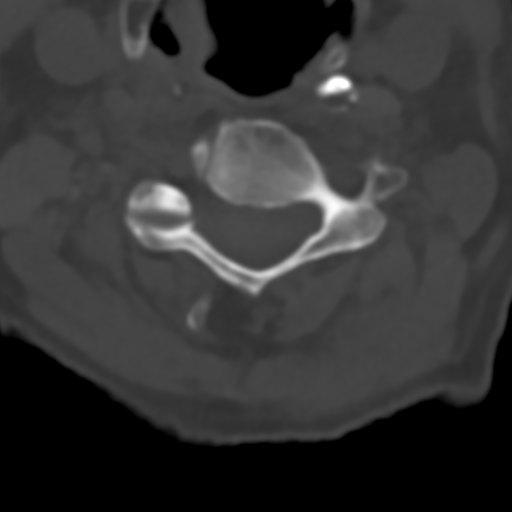
[im 87/87  bone]
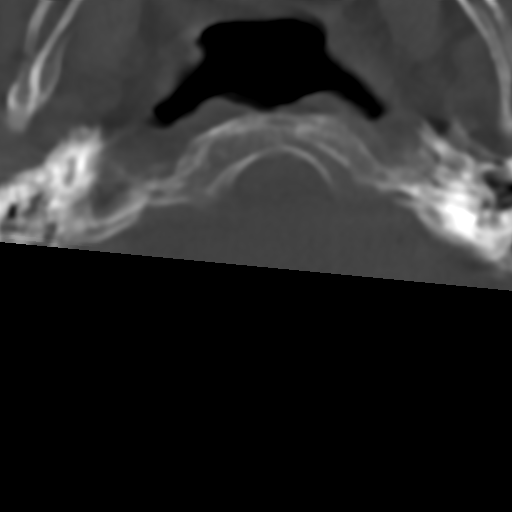

[11 of 33 positions shown; findings below may reference images not displayed]

FINDINGS: CT HEAD FINDINGS

Brain: No evidence of acute infarction, hemorrhage, hydrocephalus,
extra-axial collection or mass lesion/mass effect.

There is ventricular and sulcal enlargement reflecting moderate
generalized atrophy.

Vascular: No hyperdense vessel or unexpected calcification.

Skull: No skull fracture or skull lesion.

Sinuses/Orbits: Globes and orbits are grossly unremarkable.
Dependent fluid is seen in the left sphenoid sinus and a posterior
left ethmoid air cell. Remaining visualized sinuses and the mastoid
air cells are clear.

Other: Left frontal scalp hematoma.

CT CERVICAL SPINE FINDINGS

Alignment: Kyphosis, apex at C5.  No spondylolisthesis.

Skull base and vertebrae: No acute fracture. No primary bone lesion
or focal pathologic process.

Soft tissues and spinal canal: No prevertebral fluid or swelling. No
visible canal hematoma. No soft tissue masses or adenopathy.

Disc levels: Moderate loss of disc height at C2-C3 and C3-C4.
Moderate loss of disc height at C5-C6. Marked loss of disc height at
C6-C7 and C7-T1. Facet degenerative changes are noted most evident
at C2-C3 and C3-C4. There is spondylotic disc bulging and
uncovertebral spurring with varying degrees of neural foraminal
narrowing, greatest on the left at C5-C6, moderate to severe. No
convincing disc herniation. Bones are demineralized.

Upper chest: No acute findings.

Other: None.
IMPRESSION: HEAD CT

1. No acute intracranial abnormalities.
2. No skull fracture.
3. Left frontal scalp hematoma.
CERVICAL CT

1. No fracture or acute finding.

## 2018-01-18 DEATH — deceased
